# Patient Record
Sex: Male | Born: 1970
Health system: Southern US, Community
[De-identification: ages and names within clinical notes are randomized; demographics above are authoritative.]

## PROBLEM LIST (undated history)

## (undated) DIAGNOSIS — Z87442 Personal history of urinary calculi: Secondary | ICD-10-CM

## (undated) DIAGNOSIS — R7989 Other specified abnormal findings of blood chemistry: Secondary | ICD-10-CM

## (undated) DIAGNOSIS — E559 Vitamin D deficiency, unspecified: Secondary | ICD-10-CM

## (undated) DIAGNOSIS — E538 Deficiency of other specified B group vitamins: Secondary | ICD-10-CM

## (undated) DIAGNOSIS — K219 Gastro-esophageal reflux disease without esophagitis: Secondary | ICD-10-CM

## (undated) DIAGNOSIS — G4730 Sleep apnea, unspecified: Secondary | ICD-10-CM

## (undated) HISTORY — DX: Other specified abnormal findings of blood chemistry: R79.89

## (undated) HISTORY — DX: Vitamin D deficiency, unspecified: E55.9

## (undated) HISTORY — PX: HERNIA REPAIR: SHX51

## (undated) HISTORY — DX: Gastro-esophageal reflux disease without esophagitis: K21.9

## (undated) HISTORY — DX: Deficiency of other specified B group vitamins: E53.8

## (undated) HISTORY — DX: Sleep apnea, unspecified: G47.30

---

## 2009-02-07 HISTORY — PX: KNEE SURGERY: SHX244

## 2009-02-21 ENCOUNTER — Ambulatory Visit: Payer: Self-pay | Admitting: Specialist

## 2011-03-20 ENCOUNTER — Emergency Department: Payer: Self-pay | Admitting: Emergency Medicine

## 2011-03-20 LAB — URINALYSIS, COMPLETE
Bilirubin,UR: NEGATIVE
Glucose,UR: NEGATIVE mg/dL (ref 0–75)
Ketone: NEGATIVE
Leukocyte Esterase: NEGATIVE
Nitrite: NEGATIVE
Ph: 8 (ref 4.5–8.0)
Protein: NEGATIVE
RBC,UR: 686 /HPF (ref 0–5)
Specific Gravity: 1.018 (ref 1.003–1.030)
Squamous Epithelial: NONE SEEN
WBC UR: 1 /HPF (ref 0–5)

## 2011-03-20 LAB — BASIC METABOLIC PANEL
Calcium, Total: 8.8 mg/dL (ref 8.5–10.1)
Chloride: 104 mmol/L (ref 98–107)
Co2: 24 mmol/L (ref 21–32)
Osmolality: 283 (ref 275–301)
Potassium: 3.7 mmol/L (ref 3.5–5.1)

## 2011-03-20 LAB — CBC
HGB: 15.4 g/dL (ref 13.0–18.0)
Platelet: 182 10*3/uL (ref 150–440)
RBC: 4.87 10*6/uL (ref 4.40–5.90)
WBC: 10 10*3/uL (ref 3.8–10.6)

## 2013-05-26 ENCOUNTER — Ambulatory Visit: Payer: Self-pay | Admitting: Gastroenterology

## 2013-06-09 ENCOUNTER — Ambulatory Visit: Payer: Self-pay | Admitting: Urology

## 2014-09-01 ENCOUNTER — Ambulatory Visit: Payer: Self-pay | Admitting: Family Medicine

## 2014-09-01 DIAGNOSIS — G4730 Sleep apnea, unspecified: Secondary | ICD-10-CM | POA: Insufficient documentation

## 2014-09-01 DIAGNOSIS — N200 Calculus of kidney: Secondary | ICD-10-CM | POA: Insufficient documentation

## 2014-09-01 DIAGNOSIS — E559 Vitamin D deficiency, unspecified: Secondary | ICD-10-CM | POA: Insufficient documentation

## 2014-09-01 DIAGNOSIS — R7989 Other specified abnormal findings of blood chemistry: Secondary | ICD-10-CM | POA: Insufficient documentation

## 2014-09-01 DIAGNOSIS — K219 Gastro-esophageal reflux disease without esophagitis: Secondary | ICD-10-CM | POA: Insufficient documentation

## 2014-09-01 DIAGNOSIS — E538 Deficiency of other specified B group vitamins: Secondary | ICD-10-CM | POA: Insufficient documentation

## 2014-09-21 ENCOUNTER — Ambulatory Visit: Payer: Medicaid Other | Admitting: Family Medicine

## 2014-09-26 ENCOUNTER — Ambulatory Visit: Payer: Medicaid Other | Admitting: Family Medicine

## 2014-10-05 ENCOUNTER — Telehealth: Payer: Self-pay | Admitting: Family Medicine

## 2014-10-05 NOTE — Telephone Encounter (Signed)
That's fine to make the referral; document what you can (pain, swelling, injury, and which side); add to my referral to help Tiffany; thanks

## 2014-10-05 NOTE — Telephone Encounter (Signed)
So after researching this pt info. In order for him to receive a referral he has to be seen in the office because of his insurance medicaid requirement. Unless she has seen him for this issue already and documented this issue it will be fine to move forward. OR unless he no longer has Medicaid coverage. If not tell the wife he needs to be seen by his provider in order to get a referral.

## 2014-10-05 NOTE — Telephone Encounter (Signed)
Patient's wife states it is his right should. There was no injury, it just started hurting. Pain x 3 months, no swelling.

## 2014-10-05 NOTE — Telephone Encounter (Signed)
Routing to provider  

## 2014-10-05 NOTE — Telephone Encounter (Signed)
pts wife called and wanted referral to go see specialist about his shoulder. wife says she's left messages but i dont see any. She continued to talk over me and after i explained to her that i was only trying to help she calmed down a little bit but she was pretty upset and just basically wants a referral without an appt even after i explained that referrals are typically done through an appt.

## 2014-10-09 NOTE — Telephone Encounter (Signed)
I spoke with patients wife, she stated that she had already called [facility] and they had scheduled him an appointment for this Thursday. She was going to call them back and make sure they knew he had Medicaid.

## 2014-10-09 NOTE — Telephone Encounter (Signed)
Left message to call.

## 2015-04-26 ENCOUNTER — Ambulatory Visit (INDEPENDENT_AMBULATORY_CARE_PROVIDER_SITE_OTHER): Payer: Medicaid Other | Admitting: Family Medicine

## 2015-04-26 ENCOUNTER — Encounter: Payer: Self-pay | Admitting: Family Medicine

## 2015-04-26 VITALS — BP 116/72 | HR 54 | Temp 98.3°F | Ht 64.0 in | Wt 172.0 lb

## 2015-04-26 DIAGNOSIS — Z23 Encounter for immunization: Secondary | ICD-10-CM | POA: Diagnosis not present

## 2015-04-26 DIAGNOSIS — Z0000 Encounter for general adult medical examination without abnormal findings: Secondary | ICD-10-CM

## 2015-04-26 DIAGNOSIS — E291 Testicular hypofunction: Secondary | ICD-10-CM | POA: Diagnosis not present

## 2015-04-26 DIAGNOSIS — R7989 Other specified abnormal findings of blood chemistry: Secondary | ICD-10-CM

## 2015-04-26 DIAGNOSIS — E559 Vitamin D deficiency, unspecified: Secondary | ICD-10-CM

## 2015-04-26 DIAGNOSIS — L609 Nail disorder, unspecified: Secondary | ICD-10-CM | POA: Diagnosis not present

## 2015-04-26 DIAGNOSIS — K219 Gastro-esophageal reflux disease without esophagitis: Secondary | ICD-10-CM | POA: Diagnosis not present

## 2015-04-26 DIAGNOSIS — G4730 Sleep apnea, unspecified: Secondary | ICD-10-CM

## 2015-04-26 DIAGNOSIS — B353 Tinea pedis: Secondary | ICD-10-CM | POA: Diagnosis not present

## 2015-04-26 MED ORDER — ALUMINUM CHLORIDE 20 % EX SOLN: Freq: Every day | CUTANEOUS | Status: DC

## 2015-04-26 MED ORDER — ECONAZOLE NITRATE 1 % EX CREA: TOPICAL_CREAM | Freq: Every day | CUTANEOUS | Status: DC

## 2015-04-26 NOTE — Assessment & Plan Note (Addendum)
Will order referral to pulm for evaluation and treatment of possible OSA

## 2015-04-26 NOTE — Patient Instructions (Addendum)
We'll have you try the new medicine for your feet to see if it will help decrease sweating Use the new cream between your toes We'll have you see the dermatologist about your fingernail and feet Try to avoid foods and drinks that trigger reflux (tomatoes, garlic, onions, peppers, coffee, orange juice, etc.) We'll let you know about your lab results We'll refer you to someone to evaluate you for sleep apnea Do not drive if tired or you feel like you might fall asleep behind the wheel You received the flu shot today; it should protect you against the flu virus over the coming months; it will take about two weeks for antibodies to develop; do try to stay away from hospitals, nursing homes, and daycares during peak flu season; taking extra vitamin C daily during flu season may help you avoid getting sick   Health Maintenance, Male A healthy lifestyle and preventative care can promote health and wellness.  Maintain regular health, dental, and eye exams.  Eat a healthy diet. Foods like vegetables, fruits, whole grains, low-fat dairy products, and lean protein foods contain the nutrients you need and are low in calories. Decrease your intake of foods high in solid fats, added sugars, and salt. Get information about a proper diet from your health care provider, if necessary.  Regular physical exercise is one of the most important things you can do for your health. Most adults should get at least 150 minutes of moderate-intensity exercise (any activity that increases your heart rate and causes you to sweat) each week. In addition, most adults need muscle-strengthening exercises on 2 or more days a week.   Maintain a healthy weight. The body mass index (BMI) is a screening tool to identify possible weight problems. It provides an estimate of body fat based on height and weight. Your health care provider can find your BMI and can help you achieve or maintain a healthy weight. For males 20 years and  older:  A BMI below 18.5 is considered underweight.  A BMI of 18.5 to 24.9 is normal.  A BMI of 25 to 29.9 is considered overweight.  A BMI of 30 and above is considered obese.  Maintain normal blood lipids and cholesterol by exercising and minimizing your intake of saturated fat. Eat a balanced diet with plenty of fruits and vegetables. Blood tests for lipids and cholesterol should begin at age 21 and be repeated every 5 years. If your lipid or cholesterol levels are high, you are over age 64, or you are at high risk for heart disease, you may need your cholesterol levels checked more frequently.Ongoing high lipid and cholesterol levels should be treated with medicines if diet and exercise are not working.  If you smoke, find out from your health care provider how to quit. If you do not use tobacco, do not start.  Lung cancer screening is recommended for adults aged 55-80 years who are at high risk for developing lung cancer because of a history of smoking. A yearly low-dose CT scan of the lungs is recommended for people who have at least a 30-pack-year history of smoking and are current smokers or have quit within the past 15 years. A pack year of smoking is smoking an average of 1 pack of cigarettes a day for 1 year (for example, a 30-pack-year history of smoking could mean smoking 1 pack a day for 30 years or 2 packs a day for 15 years). Yearly screening should continue until the smoker has stopped smoking  for at least 15 years. Yearly screening should be stopped for people who develop a health problem that would prevent them from having lung cancer treatment.  If you choose to drink alcohol, do not have more than 2 drinks per day. One drink is considered to be 12 oz (360 mL) of beer, 5 oz (150 mL) of wine, or 1.5 oz (45 mL) of liquor.  Avoid the use of street drugs. Do not share needles with anyone. Ask for help if you need support or instructions about stopping the use of drugs.  High  blood pressure causes heart disease and increases the risk of stroke. High blood pressure is more likely to develop in:  People who have blood pressure in the end of the normal range (100-139/85-89 mm Hg).  People who are overweight or obese.  People who are African American.  If you are 65-41 years of age, have your blood pressure checked every 3-5 years. If you are 32 years of age or older, have your blood pressure checked every year. You should have your blood pressure measured twice--once when you are at a hospital or clinic, and once when you are not at a hospital or clinic. Record the average of the two measurements. To check your blood pressure when you are not at a hospital or clinic, you can use:  An automated blood pressure machine at a pharmacy.  A home blood pressure monitor.  If you are 32-15 years old, ask your health care provider if you should take aspirin to prevent heart disease.  Diabetes screening involves taking a blood sample to check your fasting blood sugar level. This should be done once every 3 years after age 58 if you are at a normal weight and without risk factors for diabetes. Testing should be considered at a younger age or be carried out more frequently if you are overweight and have at least 1 risk factor for diabetes.  Colorectal cancer can be detected and often prevented. Most routine colorectal cancer screening begins at the age of 22 and continues through age 74. However, your health care provider may recommend screening at an earlier age if you have risk factors for colon cancer. On a yearly basis, your health care provider may provide home test kits to check for hidden blood in the stool. A small camera at the end of a tube may be used to directly examine the colon (sigmoidoscopy or colonoscopy) to detect the earliest forms of colorectal cancer. Talk to your health care provider about this at age 68 when routine screening begins. A direct exam of the colon  should be repeated every 5-10 years through age 106, unless early forms of precancerous polyps or small growths are found.  People who are at an increased risk for hepatitis B should be screened for this virus. You are considered at high risk for hepatitis B if:  You were born in a country where hepatitis B occurs often. Talk with your health care provider about which countries are considered high risk.  Your parents were born in a high-risk country and you have not received a shot to protect against hepatitis B (hepatitis B vaccine).  You have HIV or AIDS.  You use needles to inject street drugs.  You live with, or have sex with, someone who has hepatitis B.  You are a man who has sex with other men (MSM).  You get hemodialysis treatment.  You take certain medicines for conditions like cancer, organ transplantation, and  autoimmune conditions.  Hepatitis C blood testing is recommended for all people born from [birth year] through 1966 and any individual with known risk factors for hepatitis C.  Healthy men should no longer receive prostate-specific antigen (PSA) blood tests as part of routine cancer screening. Talk to your health care provider about prostate cancer screening.  Testicular cancer screening is not recommended for adolescents or adult males who have no symptoms. Screening includes self-exam, a health care provider exam, and other screening tests. Consult with your health care provider about any symptoms you have or any concerns you have about testicular cancer.  Practice safe sex. Use condoms and avoid high-risk sexual practices to reduce the spread of sexually transmitted infections (STIs).  You should be screened for STIs, including gonorrhea and chlamydia if:  You are sexually active and are younger than 24 years.  You are older than 24 years, and your health care provider tells you that you are at risk for this type of infection.  Your sexual activity has changed since you  were last screened, and you are at an increased risk for chlamydia or gonorrhea. Ask your health care provider if you are at risk.  If you are at risk of being infected with HIV, it is recommended that you take a prescription medicine daily to prevent HIV infection. This is called pre-exposure prophylaxis (PrEP). You are considered at risk if:  You are a man who has sex with other men (MSM).  You are a heterosexual man who is sexually active with multiple partners.  You take drugs by injection.  You are sexually active with a partner who has HIV.  Talk with your health care provider about whether you are at high risk of being infected with HIV. If you choose to begin PrEP, you should first be tested for HIV. You should then be tested every 3 months for as long as you are taking PrEP.  Use sunscreen. Apply sunscreen liberally and repeatedly throughout the day. You should seek shade when your shadow is shorter than you. Protect yourself by wearing long sleeves, pants, a wide-brimmed hat, and sunglasses year round whenever you are outdoors.  Tell your health care provider of new moles or changes in moles, especially if there is a change in shape or color. Also, tell your health care provider if a mole is larger than the size of a pencil eraser.  A one-time screening for abdominal aortic aneurysm (AAA) and surgical repair of large AAAs by ultrasound is recommended for men aged 65-75 years who are current or former smokers.  Stay current with your vaccines (immunizations).   This information is not intended to replace advice given to you by your health care provider. Make sure you discuss any questions you have with your health care provider.   Document Released: 08/23/2007 Document Revised: 03/17/2014 Document Reviewed: 07/22/2010 Elsevier Interactive Patient Education [employer]

## 2015-04-26 NOTE — Assessment & Plan Note (Signed)
Check level today 

## 2015-04-26 NOTE — Assessment & Plan Note (Addendum)
Avoid eating late in the evening; avoid triggers; may try elevating HOB; no red flags

## 2015-04-26 NOTE — Progress Notes (Signed)
Patient ID: Carl Mcfarland, male   DOB: 1970-07-19, 45 y.o.   MRN: [id number]   Subjective:   Carl Mcfarland is a 45 y.o. male here for a complete physical exam  Interim issues since last visit: nothing  USPSTF grade A and B recommendations Alcohol: no Depression: Depression screen [facility] 2/9 04/26/2015  Decreased Interest 0  Down, Depressed, Hopeless 1  PHQ - 2 Score 1    Hypertension: well-controlled Obesity: he'll start to lose a little weight Tobacco use: nonsmoker HIV, hep B, hep C: politely declined STD testing and prevention (chl/gon/syphilis): asx Lipids: had a little bit in the morning Glucose: check today Colorectal cancer: no family hx, start at age 64 Aspirin: no heart attacks, no stroke; not needed Diet: good eater, at home, rarely fast food Exercise: exercise some Skin cancer: no worrisome moles  Past Medical History  Diagnosis Date  . Sleep apnea   . GERD (gastroesophageal reflux disease)   . Calculus of kidney   . Vitamin D deficiency disease   . Vitamin B12 deficiency   . Low testosterone    Past Surgical History  Procedure Laterality Date  . Hernia repair      umbilical  . Knee surgery Right Dec. 2010   Family History  Problem Relation Age of Onset  . Diabetes Mother   not overweight  Social History  Substance Use Topics  . Smoking status: Never Smoker   . Smokeless tobacco: Never Used  . Alcohol Use: Yes     Comment: socially   Review of Systems  Constitutional: Negative for unexpected weight change.  HENT: Negative for hearing loss.   Eyes: Positive for visual disturbance.  Cardiovascular: Negative for chest pain and leg swelling.  Endocrine: Negative for polydipsia.  Genitourinary: Negative for decreased urine volume and difficulty urinating.  Musculoskeletal: Negative for arthralgias.  Allergic/Immunologic: Negative for food allergies.  Hematological: Negative for adenopathy. Does not bruise/bleed easily.   Left ulnar 2nd finger growth  base of nail --> refer derm Fungus between the toes; athletes foot creams; they kept it at bay for a little bit; last year cleared up; tries to keep open  Objective:   Filed Vitals:   04/26/15 1507  BP: 116/72  Pulse: 54  Temp: 98.3 F (36.8 C)  Height:  (1.626 m)  Weight: 172 lb (78.019 kg)  SpO2: 98%   Body mass index is 29.51 kg/(m^2). Wt Readings from Last 3 Encounters:  04/26/15 172 lb (78.019 kg)  07/04/14 173 lb (78.472 kg)   Physical Exam  Constitutional: He appears well-developed and well-nourished. No distress.  HENT:  Head: Normocephalic and atraumatic.  Nose: Nose normal.  Mouth/Throat: Oropharynx is clear and moist.  Eyes: EOM are normal. No scleral icterus.  Neck: No JVD present. No thyromegaly present.  Cardiovascular: Normal rate, regular rhythm and normal heart sounds.   Pulmonary/Chest: Effort normal and breath sounds normal. No respiratory distress. He has no wheezes. He has no rales.  Abdominal: Soft. Bowel sounds are normal. He exhibits no distension. There is no tenderness. There is no guarding.  Musculoskeletal: Normal range of motion. He exhibits no edema.  Lymphadenopathy:    He has no cervical adenopathy.  Neurological: He is alert. He displays normal reflexes. He exhibits normal muscle tone. Coordination normal.  Skin: Skin is warm and dry. Rash (maceration between 4th and 5th toes) noted. He is not diaphoretic. No erythema. No pallor.  Abnormal growth along edge of fingernail/cuticle  Psychiatric: He has a normal mood  and affect. His behavior is normal. Judgment and thought content normal.    Assessment/Plan:   Problem List Items Addressed This Visit      Digestive   GERD (gastroesophageal reflux disease)    Avoid eating late in the evening; avoid triggers; may try elevating HOB; no red flags        Musculoskeletal and Integument   Nail abnormality    Refer to derm, may need excision      Relevant Orders   Ambulatory referral to  Dermatology   Tinea pedis    Start cream and talk to derm if not resolved by time of appt; avoid keeping feet closed up in sweaty socks/shoes all day; try drysol for excessive sweating      Relevant Medications   econazole nitrate 1 % cream   Other Relevant Orders   Ambulatory referral to Dermatology     Other   Sleep apnea    Will order referral to pulm for evaluation and treatment of possible OSA      Relevant Orders   Ambulatory referral to Pulmonology   Vitamin D deficiency disease    Recheck level today      Relevant Orders   VITAMIN D 25 Hydroxy (Vit-D Deficiency, Fractures) (Completed)   Low testosterone    Check level today      Relevant Orders   Testosterone, free, total (Completed)   Preventative health care - Primary    USPSTF grade A and B recommendations reviewed with patient; age-appropriate recommendations, preventive care, screening tests, etc discussed and encouraged; healthy living encouraged; see AVS for patient education given to patient      Relevant Orders   Lipid Panel w/o Chol/HDL Ratio (Completed)   CBC with Differential/Platelet (Completed)   Comprehensive metabolic panel (Completed)    Other Visit Diagnoses    Need for influenza vaccination        Relevant Orders    Flu Vaccine QUAD 36+ mos PF IM (Fluarix & Fluzone Quad PF)       Meds ordered this encounter  Medications  . aluminum chloride (DRYSOL) 20 % external solution    Sig: Apply topically at bedtime.    Dispense:  35 mL    Refill:  0  . econazole nitrate 1 % cream    Sig: Apply topically daily.    Dispense:  15 g    Refill:  0   Orders Placed This Encounter  Procedures  . Flu Vaccine QUAD 36+ mos PF IM (Fluarix & Fluzone Quad PF)  . Lipid Panel w/o Chol/HDL Ratio    Order Specific Question:  Has the patient fasted?    Answer:  Yes  . CBC with Differential/Platelet  . Comprehensive metabolic panel    Order Specific Question:  Has the patient fasted?    Answer:  Yes  .  VITAMIN D 25 Hydroxy (Vit-D Deficiency, Fractures)  . Testosterone, free, total  . Ambulatory referral to Dermatology    Referral Priority:  Routine    Referral Type:  Consultation    Referral Reason:  Specialty Services Required    Requested Specialty:  Dermatology    Number of Visits Requested:  1  . Ambulatory referral to Pulmonology    Referral Priority:  Routine    Referral Type:  Consultation    Referral Reason:  Specialty Services Required    Requested Specialty:  Pulmonary Disease    Number of Visits Requested:  1    Follow up plan: Return in about  1 year (around 04/25/2016) for complete physical.  An after-visit summary was printed and given to the patient at check-out.  Please see the patient instructions which may contain other information and recommendations beyond what is mentioned above in the assessment and plan.  Orders Placed This Encounter  Procedures  . Flu Vaccine QUAD 36+ mos PF IM (Fluarix & Fluzone Quad PF)  . Lipid Panel w/o Chol/HDL Ratio  . CBC with Differential/Platelet  . Comprehensive metabolic panel  . VITAMIN D 25 Hydroxy (Vit-D Deficiency, Fractures)  . Testosterone, free, total  . Ambulatory referral to Dermatology  . Ambulatory referral to Pulmonology   Flu shot was supposed to have been given today; I am trying to sign off of this note and immunization administration is "pending" by Caryn Bee; I clicked the button to "defer" and will send note to staff to complete documentation

## 2015-04-26 NOTE — Assessment & Plan Note (Signed)
Recheck level today 

## 2015-04-29 LAB — COMPREHENSIVE METABOLIC PANEL
ALBUMIN: 4.3 g/dL (ref 3.5–5.5)
ALT: 22 IU/L (ref 0–44)
AST: 19 IU/L (ref 0–40)
Albumin/Globulin Ratio: 1.6 (ref 1.1–2.5)
Alkaline Phosphatase: 93 IU/L (ref 39–117)
BUN / CREAT RATIO: 20 (ref 9–20)
BUN: 18 mg/dL (ref 6–24)
Bilirubin Total: 0.4 mg/dL (ref 0.0–1.2)
CALCIUM: 9.3 mg/dL (ref 8.7–10.2)
CO2: 22 mmol/L (ref 18–29)
CREATININE: 0.92 mg/dL (ref 0.76–1.27)
Chloride: 101 mmol/L (ref 96–106)
GFR calc Af Amer: 116 mL/min/{1.73_m2} (ref >59)
GFR, EST NON AFRICAN AMERICAN: 100 mL/min/{1.73_m2} (ref >59)
GLOBULIN, TOTAL: 2.7 g/dL (ref 1.5–4.5)
Glucose: 80 mg/dL (ref 65–99)
Potassium: 4.1 mmol/L (ref 3.5–5.2)
SODIUM: 140 mmol/L (ref 134–144)
Total Protein: 7 g/dL (ref 6.0–8.5)

## 2015-04-29 LAB — VITAMIN D 25 HYDROXY (VIT D DEFICIENCY, FRACTURES): Vit D, 25-Hydroxy: 23.9 ng/mL — ABNORMAL LOW (ref 30.0–100.0)

## 2015-04-29 LAB — CBC WITH DIFFERENTIAL/PLATELET
BASOS ABS: 0 10*3/uL (ref 0.0–0.2)
Basos: 0 %
EOS (ABSOLUTE): 0 10*3/uL (ref 0.0–0.4)
Eos: 0 %
Hematocrit: 45.1 % (ref 37.5–51.0)
Hemoglobin: 15.5 g/dL (ref 12.6–17.7)
Immature Grans (Abs): 0 10*3/uL (ref 0.0–0.1)
Immature Granulocytes: 0 %
LYMPHS ABS: 2.6 10*3/uL (ref 0.7–3.1)
Lymphs: 37 %
MCH: 30.6 pg (ref 26.6–33.0)
MCHC: 34.4 g/dL (ref 31.5–35.7)
MCV: 89 fL (ref 79–97)
Monocytes Absolute: 0.5 10*3/uL (ref 0.1–0.9)
Monocytes: 6 %
Neutrophils Absolute: 4 10*3/uL (ref 1.4–7.0)
Neutrophils: 57 %
PLATELETS: 211 10*3/uL (ref 150–379)
RBC: 5.06 x10E6/uL (ref 4.14–5.80)
RDW: 14 % (ref 12.3–15.4)
WBC: 7.1 10*3/uL (ref 3.4–10.8)

## 2015-04-29 LAB — TESTOSTERONE, FREE, TOTAL, SHBG
SEX HORMONE BINDING: 33.8 nmol/L (ref 16.5–55.9)
TESTOSTERONE: 373 ng/dL (ref 348–1197)
Testosterone, Free: 7.8 pg/mL (ref 6.8–21.5)

## 2015-04-29 LAB — LIPID PANEL W/O CHOL/HDL RATIO
Cholesterol, Total: 220 mg/dL — ABNORMAL HIGH (ref 100–199)
HDL: 49 mg/dL (ref >39)
LDL CALC: 152 mg/dL — AB (ref 0–99)
TRIGLYCERIDES: 95 mg/dL (ref 0–149)
VLDL Cholesterol Cal: 19 mg/dL (ref 5–40)

## 2015-05-03 ENCOUNTER — Telehealth: Payer: Self-pay | Admitting: Family Medicine

## 2015-05-03 NOTE — Telephone Encounter (Signed)
I called patient about labs LDL is elevated Voicemail not set up

## 2015-05-05 ENCOUNTER — Encounter: Payer: Self-pay | Admitting: Family Medicine

## 2015-05-05 DIAGNOSIS — Z0000 Encounter for general adult medical examination without abnormal findings: Secondary | ICD-10-CM | POA: Insufficient documentation

## 2015-05-05 NOTE — Assessment & Plan Note (Signed)
Refer to derm, may need excision

## 2015-05-05 NOTE — Assessment & Plan Note (Signed)
USPSTF grade A and B recommendations reviewed with patient; age-appropriate recommendations, preventive care, screening tests, etc discussed and encouraged; healthy living encouraged; see AVS for patient education given to patient  

## 2015-05-05 NOTE — Assessment & Plan Note (Signed)
Start cream and talk to derm if not resolved by time of appt; avoid keeping feet closed up in sweaty socks/shoes all day; try drysol for excessive sweating

## 2015-05-05 NOTE — Telephone Encounter (Signed)
I have not been able to reach patient; I'll send a letter

## 2015-05-09 NOTE — Addendum Note (Signed)
Addended by: Marshall Cork on: 05/09/2015 01:33 PM   Modules accepted: Kipp Brood

## 2015-06-07 ENCOUNTER — Encounter: Payer: Self-pay | Admitting: Internal Medicine

## 2015-06-07 ENCOUNTER — Encounter (INDEPENDENT_AMBULATORY_CARE_PROVIDER_SITE_OTHER): Payer: Self-pay

## 2015-06-07 ENCOUNTER — Ambulatory Visit (INDEPENDENT_AMBULATORY_CARE_PROVIDER_SITE_OTHER): Payer: Medicaid Other | Admitting: Internal Medicine

## 2015-06-07 VITALS — BP 124/68 | HR 57 | Ht 65.0 in | Wt 170.4 lb

## 2015-06-07 DIAGNOSIS — G4719 Other hypersomnia: Secondary | ICD-10-CM

## 2015-06-07 NOTE — Progress Notes (Signed)
[facility]  Pulmonary Medicine Consultation      Assessment and Plan:  Excessive daytime sleepiness. -Symptoms and signs suggestive of sleep apnea. -We will send for sleep study.  GERD -Sleep apnea can contribute to GERD, we'll continue to monitor. -Some of his nocturnal episodes of awakening, may have some relationship with GERD. -If continues to have symptoms after started on CPAP. We'll consider empiric treatment.  Snoring, witnessed apneas. -suspect obstructive sleep apnea, we'll send for sleep study.  Date: 06/07/2015  MRN# [id number] Carl Mcfarland [birth year]  Referring Physician: Dr. Sherie DonLada.   Carl Mcfarland is a 45 y.o. old male seen in consultation for chief complaint of:    Chief Complaint  Patient presents with  . sleep consult    pt ref by dr. lada. pt c/o gasping for air while sleep & loud snoring. no prior sleep study EPWORTH:18    HPI:   The patient is a 45 year old male referred for gasping for air. Loud snoring. He typically goes to bed around 10 PM, usually takes him about 10-15 minutes to fall asleep. Typically gets up from bed around 6 AM. His Epworth score today is 18.  His wife notes that this has been going on for several years. He has noted that he is waking himself up choking more and more often and these are becoming distressful.  He notes that he is sleepy during the day. This appears to be his main problem, more so than excessive daytime sleepiness, though he does complain of some fatigue during the day, he occasionally has taken a nap during the day.   PMHX:   Past Medical History  Diagnosis Date  . Sleep apnea   . GERD (gastroesophageal reflux disease)   . Calculus of kidney   . Vitamin D deficiency disease   . Vitamin B12 deficiency   . Low testosterone    Surgical Hx:  Past Surgical History  Procedure Laterality Date  . Hernia repair      umbilical  . Knee surgery Right Dec. 2010   Family Hx:  Family History  Problem Relation Age of  Onset  . Diabetes Mother    Social Hx:   Social History  Substance Use Topics  . Smoking status: Never Smoker   . Smokeless tobacco: Never Used  . Alcohol Use: Yes     Comment: socially   Medication:   Current Outpatient Rx  Name  Route  Sig  Dispense  Refill  . aluminum chloride (DRYSOL) 20 % external solution   Topical   Apply topically at bedtime.   35 mL   0   . econazole nitrate 1 % cream   Topical   Apply topically daily.   15 g   0       Allergies:  Review of patient's allergies indicates no known allergies.  Review of Systems: Gen:  Denies  fever, sweats, chills HEENT: Denies blurred vision, double vision. bleeds, sore throat Cvc:  No dizziness, chest pain. Resp:   Denies cough or sputum production, shortness of breath Gi: Denies swallowing difficulty, stomach pain. Gu:  Denies bladder incontinence, burning urine Ext:   No Joint pain, stiffness. Skin: No skin rash,  hives  Endoc:  No polyuria, polydipsia. Psych: No depression, insomnia. Other:  All other systems were reviewed with the patient and were negative other that what is mentioned in the HPI.   Physical Examination:   VS: BP 124/68 mmHg  Pulse 57  Ht 5\' 5"  (1.651 m)  Wt  170 lb 6.4 oz (77.293 kg)  BMI 28.36 kg/m2  SpO2 98%  General Appearance: No distress  Neuro:without focal findings,  speech normal,  HEENT: PERRLA, EOM intact.   Pulmonary: normal breath sounds, No wheezing.  CardiovascularNormal S1,S2.  No m/r/g.   Abdomen: Benign, Soft, non-tender. Renal:  No costovertebral tenderness  GU:  No performed at this time. Endoc: No evident thyromegaly, no signs of acromegaly. Skin:   warm, no rashes, no ecchymosis  Extremities: normal, no cyanosis, clubbing.  Other findings:    LABORATORY PANEL:   CBC No results for input(s): WBC, HGB, HCT, PLT in the last 168  hours. ------------------------------------------------------------------------------------------------------------------  Chemistries  No results for input(s): NA, K, CL, CO2, GLUCOSE, BUN, CREATININE, CALCIUM, MG, AST, ALT, ALKPHOS, BILITOT in the last 168 hours.  Invalid input(s): GFRCGP ------------------------------------------------------------------------------------------------------------------  Cardiac Enzymes No results for input(s): TROPONINI in the last 168 hours. ------------------------------------------------------------  RADIOLOGY:  No results found.     Thank  you for the consultation and for allowing [facility] to assist in the care of your patient. Our recommendations are noted above.  Please contact us if we can be of further service.   Wells Guiles, MD.  Board Certified in Internal Medicine, Pulmonary Medicine, Critical Care Medicine, and Sleep Medicine.  [facility] Office Number: [phone]  Santiago Glad, M.D.  Stephanie Acre, M.D.  Billy Fischer, M.D  06/07/2015

## 2015-06-07 NOTE — Patient Instructions (Signed)
    Sleep Apnea Sleep apnea is disorder that affects a person's sleep. A person with sleep apnea has abnormal pauses in their breathing when they sleep. It is hard for them to get a good sleep. This makes a person tired during the day. It also can lead to other physical problems. There are three types of sleep apnea. One type is when breathing stops for a short time because your airway is blocked (obstructive sleep apnea). Another type is when the brain sometimes fails to give the normal signal to breathe to the muscles that control your breathing (central sleep apnea). The third type is a combination of the other two types. HOME CARE   Take all medicine as told by your doctor.  Avoid alcohol, calming medicines (sedatives), and depressant drugs.  Try to lose weight if you are overweight. Talk to your doctor about a healthy weight goal.  Your doctor may have you use a device that helps to open your airway. It can help you get the air that you need. It is called a positive airway pressure (PAP) device.   MAKE SURE YOU:   Understand these instructions.  Will watch your condition.  Will get help right away if you are not doing well or get worse.  It may take approximately 1 month for you to get used to wearing her CPAP every night.  

## 2015-06-12 ENCOUNTER — Ambulatory Visit: Payer: Medicaid Other | Admitting: Family Medicine

## 2015-07-13 ENCOUNTER — Ambulatory Visit: Payer: Medicaid Other | Attending: Pulmonary Disease

## 2015-07-13 DIAGNOSIS — R0683 Snoring: Secondary | ICD-10-CM | POA: Diagnosis not present

## 2015-07-13 DIAGNOSIS — G4719 Other hypersomnia: Secondary | ICD-10-CM | POA: Insufficient documentation

## 2015-07-18 DIAGNOSIS — R0683 Snoring: Secondary | ICD-10-CM | POA: Diagnosis not present

## 2015-07-19 ENCOUNTER — Telehealth: Payer: Self-pay | Admitting: *Deleted

## 2015-07-19 NOTE — Telephone Encounter (Signed)
Pt informed sleep study was negative for sleep apnea. Nothing further needed.

## 2015-11-14 ENCOUNTER — Ambulatory Visit: Payer: Medicaid Other | Admitting: Family Medicine

## 2016-03-11 ENCOUNTER — Inpatient Hospital Stay
Admission: RE | Admit: 2016-03-11 | Discharge: 2016-03-11 | Disposition: A | Discharge status: Home / Self Care | Payer: Medicaid Other | Source: Ambulatory Visit

## 2016-03-11 HISTORY — DX: Personal history of urinary calculi: Z87.442

## 2016-03-12 ENCOUNTER — Encounter
Admission: RE | Admit: 2016-03-12 | Discharge: 2016-03-12 | Disposition: A | Discharge status: Home / Self Care | Payer: Medicaid Other | Source: Ambulatory Visit | Attending: Orthopedic Surgery | Admitting: Orthopedic Surgery

## 2016-03-12 NOTE — Patient Instructions (Signed)
  Your procedure is scheduled on: 03-20-16 Report to Same Day Surgery 2nd floor medical mall [facility](Medical Mall Entrance-take elevator on left to 2nd floor.  Check in with surgery information desk.) To find out your arrival time please call [phone] between 1PM - 3PM on 03-19-16  Remember: Instructions that are not followed completely may result in serious medical risk, up to and including death, or upon the discretion of your surgeon and anesthesiologist your surgery may need to be rescheduled.    _x___ 1. Do not eat food or drink liquids after midnight. No gum chewing or hard candies.     __x__ 2. No Alcohol for 24 hours before or after surgery.   __x__3. No Smoking for 24 prior to surgery.   ____  4. Bring all medications with you on the day of surgery if instructed.    __x__ 5. Notify your doctor if there is any change in your medical condition     (cold, fever, infections).     Do not wear jewelry, make-up, hairpins, clips or nail polish.  Do not wear lotions, powders, or perfumes. You may wear deodorant.  Do not shave 48 hours prior to surgery. Men may shave face and neck.  Do not bring valuables to the hospital.    [facility] is not responsible for any belongings or valuables.               Contacts, dentures or bridgework may not be worn into surgery.  Leave your suitcase in the car. After surgery it may be brought to your room.  For patients admitted to the hospital, discharge time is determined by your treatment team.   Patients discharged the day of surgery will not be allowed to drive home.  You will need someone to drive you home and stay with you the night of your procedure.    Please read over the following fact sheets that you were given:   [facility] Preparing for Surgery and or MRSA Information   _x___ Take these medicines the morning of surgery with A SIP OF WATER:    1. PRILOSEC  2. TAKE A PRILOSEC ON Wednesday NIGHT BEFORE BED  3.  4.  5.  6.  ____Fleets  enema or Magnesium Citrate as directed.   ____ Use CHG Soap or sage wipes as directed on instruction sheet   ____ Use inhalers on the day of surgery and bring to hospital day of surgery  ____ Stop metformin 2 days prior to surgery    ____ Take 1/2 of usual insulin dose the night before surgery and none on the morning of  surgery.   ____ Stop Aspirin, Coumadin, Pllavix ,Eliquis, Effient, or Pradaxa  x__ Stop Anti-inflammatories such as Advil, Aleve, Ibuprofen, Motrin, Naproxen,          Naprosyn, Goodies powders or aspirin products NOW-Ok to take Tylenol.   ____ Stop supplements until after surgery.    ____ Bring C-Pap to the hospital.

## 2016-03-19 MED ORDER — CEFAZOLIN SODIUM-DEXTROSE 2-4 GM/100ML-% IV SOLN
2.0000 g | Freq: Once | INTRAVENOUS | Status: AC
  Administered 2016-03-20: 2 g via INTRAVENOUS

## 2016-03-20 ENCOUNTER — Ambulatory Visit: Payer: Medicaid Other | Admitting: Anesthesiology

## 2016-03-20 ENCOUNTER — Ambulatory Visit
Admission: RE | Admit: 2016-03-20 | Discharge: 2016-03-20 | Disposition: A | Discharge status: Home / Self Care | Payer: Medicaid Other | Source: Ambulatory Visit | Attending: Orthopedic Surgery | Admitting: Orthopedic Surgery

## 2016-03-20 ENCOUNTER — Encounter
Admission: RE | Disposition: A | Discharge status: Home / Self Care | Payer: Self-pay | Source: Ambulatory Visit | Attending: Orthopedic Surgery

## 2016-03-20 DIAGNOSIS — K219 Gastro-esophageal reflux disease without esophagitis: Secondary | ICD-10-CM | POA: Insufficient documentation

## 2016-03-20 DIAGNOSIS — M67431 Ganglion, right wrist: Secondary | ICD-10-CM | POA: Diagnosis not present

## 2016-03-20 HISTORY — PX: GANGLION CYST EXCISION: SHX1691

## 2016-03-20 SURGERY — EXCISION, GANGLION CYST, WRIST
Anesthesia: General | Laterality: Right | Wound class: Clean Contaminated

## 2016-03-20 MED ORDER — BUPIVACAINE HCL (PF) 0.5 % IJ SOLN
INTRAMUSCULAR | Status: AC
  Filled 2016-03-20: qty 30

## 2016-03-20 MED ORDER — ONDANSETRON HCL 4 MG PO TABS: 4.0000 mg | ORAL_TABLET | Freq: Four times a day (QID) | ORAL | Status: DC | PRN

## 2016-03-20 MED ORDER — MIDAZOLAM HCL 2 MG/2ML IJ SOLN
INTRAMUSCULAR | Status: DC | PRN
  Administered 2016-03-20: 2 mg via INTRAVENOUS

## 2016-03-20 MED ORDER — PROPOFOL 10 MG/ML IV BOLUS
INTRAVENOUS | Status: DC | PRN
  Administered 2016-03-20: 130 mg via INTRAVENOUS

## 2016-03-20 MED ORDER — PROPOFOL 10 MG/ML IV BOLUS
INTRAVENOUS | Status: AC
  Filled 2016-03-20: qty 20

## 2016-03-20 MED ORDER — ONDANSETRON HCL 4 MG/2ML IJ SOLN
INTRAMUSCULAR | Status: DC | PRN
  Administered 2016-03-20: 4 mg via INTRAVENOUS

## 2016-03-20 MED ORDER — MEPERIDINE HCL 25 MG/ML IJ SOLN: 6.2500 mg | INTRAMUSCULAR | Status: DC | PRN

## 2016-03-20 MED ORDER — KETOROLAC TROMETHAMINE 30 MG/ML IJ SOLN
INTRAMUSCULAR | Status: AC
  Filled 2016-03-20: qty 1

## 2016-03-20 MED ORDER — KETOROLAC TROMETHAMINE 30 MG/ML IJ SOLN
INTRAMUSCULAR | Status: DC | PRN
  Administered 2016-03-20: 30 mg via INTRAVENOUS

## 2016-03-20 MED ORDER — HYDROCODONE-ACETAMINOPHEN 5-325 MG PO TABS: 1.0000 | ORAL_TABLET | Freq: Four times a day (QID) | ORAL | 0 refills | Status: DC | PRN

## 2016-03-20 MED ORDER — ONDANSETRON HCL 4 MG/2ML IJ SOLN: 4.0000 mg | Freq: Four times a day (QID) | INTRAMUSCULAR | Status: DC | PRN

## 2016-03-20 MED ORDER — METOCLOPRAMIDE HCL 5 MG/ML IJ SOLN: 5.0000 mg | Freq: Three times a day (TID) | INTRAMUSCULAR | Status: DC | PRN

## 2016-03-20 MED ORDER — MIDAZOLAM HCL 2 MG/2ML IJ SOLN
INTRAMUSCULAR | Status: AC
  Filled 2016-03-20: qty 2

## 2016-03-20 MED ORDER — SODIUM CHLORIDE 0.9 % IV SOLN: INTRAVENOUS | Status: DC

## 2016-03-20 MED ORDER — HYDROCODONE-ACETAMINOPHEN 5-325 MG PO TABS: 1.0000 | ORAL_TABLET | ORAL | Status: DC | PRN

## 2016-03-20 MED ORDER — FENTANYL CITRATE (PF) 100 MCG/2ML IJ SOLN
INTRAMUSCULAR | Status: AC
  Filled 2016-03-20: qty 2

## 2016-03-20 MED ORDER — CEFAZOLIN SODIUM-DEXTROSE 2-4 GM/100ML-% IV SOLN
INTRAVENOUS | Status: AC
  Filled 2016-03-20: qty 100

## 2016-03-20 MED ORDER — OXYCODONE HCL 5 MG/5ML PO SOLN: 5.0000 mg | Freq: Once | ORAL | Status: DC | PRN

## 2016-03-20 MED ORDER — BUPIVACAINE HCL 0.5 % IJ SOLN
INTRAMUSCULAR | Status: DC | PRN
  Administered 2016-03-20: 5 mL

## 2016-03-20 MED ORDER — GLYCOPYRROLATE 0.2 MG/ML IJ SOLN
INTRAMUSCULAR | Status: DC | PRN
  Administered 2016-03-20: 0.2 mg via INTRAVENOUS

## 2016-03-20 MED ORDER — LACTATED RINGERS IV SOLN
INTRAVENOUS | Status: DC
Start: 2016-03-20 — End: 2016-03-20
  Administered 2016-03-20: 08:00:00 via INTRAVENOUS

## 2016-03-20 MED ORDER — FENTANYL CITRATE (PF) 100 MCG/2ML IJ SOLN
INTRAMUSCULAR | Status: DC | PRN
  Administered 2016-03-20 (×4): 25 ug via INTRAVENOUS

## 2016-03-20 MED ORDER — GLYCOPYRROLATE 0.2 MG/ML IJ SOLN
INTRAMUSCULAR | Status: AC
  Filled 2016-03-20: qty 1

## 2016-03-20 MED ORDER — METOCLOPRAMIDE HCL 10 MG PO TABS
5.0000 mg | ORAL_TABLET | Freq: Three times a day (TID) | ORAL | Status: DC | PRN
Start: 2016-03-20 — End: 2016-03-20

## 2016-03-20 MED ORDER — LIDOCAINE 2% (20 MG/ML) 5 ML SYRINGE
INTRAMUSCULAR | Status: AC
  Filled 2016-03-20: qty 5

## 2016-03-20 MED ORDER — DEXAMETHASONE SODIUM PHOSPHATE 4 MG/ML IJ SOLN
INTRAMUSCULAR | Status: DC | PRN
  Administered 2016-03-20: 5 mg via INTRAVENOUS

## 2016-03-20 MED ORDER — ONDANSETRON HCL 4 MG/2ML IJ SOLN
INTRAMUSCULAR | Status: AC
  Filled 2016-03-20: qty 2

## 2016-03-20 MED ORDER — LIDOCAINE HCL (CARDIAC) 20 MG/ML IV SOLN
INTRAVENOUS | Status: DC | PRN
  Administered 2016-03-20: 100 mg via INTRAVENOUS

## 2016-03-20 MED ORDER — FENTANYL CITRATE (PF) 100 MCG/2ML IJ SOLN: 25.0000 ug | INTRAMUSCULAR | Status: DC | PRN

## 2016-03-20 MED ORDER — DEXAMETHASONE SODIUM PHOSPHATE 10 MG/ML IJ SOLN
INTRAMUSCULAR | Status: AC
  Filled 2016-03-20: qty 1

## 2016-03-20 MED ORDER — OXYCODONE HCL 5 MG PO TABS: 5.0000 mg | ORAL_TABLET | Freq: Once | ORAL | Status: DC | PRN

## 2016-03-20 MED ORDER — PROMETHAZINE HCL 25 MG/ML IJ SOLN: 6.2500 mg | INTRAMUSCULAR | Status: DC | PRN

## 2016-03-20 SURGICAL SUPPLY — 32 items
BANDAGE ELASTIC 3 LF NS (GAUZE/BANDAGES/DRESSINGS) ×2 IMPLANT
BNDG ESMARK 4X12 TAN STRL LF (GAUZE/BANDAGES/DRESSINGS) ×2 IMPLANT
CANISTER SUCT 1200ML W/VALVE (MISCELLANEOUS) ×2 IMPLANT
CAST PADDING 3X4FT ST 30246 (SOFTGOODS) ×1
CHLORAPREP W/TINT 26ML (MISCELLANEOUS) ×2 IMPLANT
CUFF TOURN 18 STER (MISCELLANEOUS) IMPLANT
ELECT CAUTERY NEEDLE 2.0 MIC (NEEDLE) ×2 IMPLANT
GAUZE PETRO XEROFOAM 1X8 (MISCELLANEOUS) ×2 IMPLANT
GAUZE SPONGE 4X4 12PLY STRL (GAUZE/BANDAGES/DRESSINGS) ×2 IMPLANT
GAUZE XEROFORM 4X4 STRL (GAUZE/BANDAGES/DRESSINGS) ×2 IMPLANT
GLOVE BIOGEL PI IND STRL 9 (GLOVE) ×1 IMPLANT
GLOVE BIOGEL PI INDICATOR 9 (GLOVE) ×1
GLOVE SURG SYN 9.0  PF PI (GLOVE) ×1
GLOVE SURG SYN 9.0 PF PI (GLOVE) ×1 IMPLANT
GOWN SRG 2XL LVL 4 RGLN SLV (GOWNS) ×1 IMPLANT
GOWN STRL NON-REIN 2XL LVL4 (GOWNS) ×1
GOWN STRL REUS W/ TWL LRG LVL3 (GOWN DISPOSABLE) ×1 IMPLANT
GOWN STRL REUS W/TWL LRG LVL3 (GOWN DISPOSABLE) ×1
KIT RM TURNOVER STRD PROC AR (KITS) ×2 IMPLANT
NEEDLE HYPO 25X1 1.5 SAFETY (NEEDLE) ×2 IMPLANT
NS IRRIG 500ML POUR BTL (IV SOLUTION) ×2 IMPLANT
PACK EXTREMITY ARMC (MISCELLANEOUS) ×2 IMPLANT
PAD CAST CTTN 3X4 STRL (SOFTGOODS) ×1 IMPLANT
SPLINT CAST 1 STEP 3X12 (MISCELLANEOUS) ×2 IMPLANT
STOCKINETTE STRL 4IN 9604848 (GAUZE/BANDAGES/DRESSINGS) ×2 IMPLANT
SUT ETHILON 4-0 (SUTURE) ×1
SUT ETHILON 4-0 FS2 18XMFL BLK (SUTURE) ×1
SUT ETHILON 5-0 FS-2 18 BLK (SUTURE) ×2 IMPLANT
SUT MNCRL 4-0 (SUTURE) ×1
SUT MNCRL 4-018XMFL (SUTURE) ×1
SUTURE ETHLN 4-0 FS2 18XMF BLK (SUTURE) ×1 IMPLANT
SUTURE MNCRL 4-018XMF (SUTURE) ×1 IMPLANT

## 2016-03-20 NOTE — Anesthesia Preprocedure Evaluation (Signed)
Anesthesia Evaluation  Patient identified by MRN, date of birth, ID band Patient awake    Reviewed: Allergy & Precautions, NPO status , Patient's Chart, lab work & pertinent test results  History of Anesthesia Complications Negative for: history of anesthetic complications  Airway Mallampati: II  TM Distance: >3 FB Neck ROM: Full    Dental no notable dental hx.    Pulmonary neg pulmonary ROS, neg sleep apnea, neg COPD,    breath sounds clear to auscultation- rhonchi (-) wheezing      Cardiovascular Exercise Tolerance: Good (-) hypertension(-) CAD and (-) Past MI  Rhythm:Regular Rate:Normal - Systolic murmurs and - Diastolic murmurs    Neuro/Psych negative neurological ROS  negative psych ROS   GI/Hepatic Neg liver ROS, GERD  ,  Endo/Other  negative endocrine ROSneg diabetes  Renal/GU Renal disease: hx of nephrolithiasis.     Musculoskeletal negative musculoskeletal ROS (+)   Abdominal (+) - obese,   Peds  Hematology negative hematology ROS (+)   Anesthesia Other Findings Past Medical History: No date: GERD (gastroesophageal reflux disease) No date: History of kidney stones No date: Low testosterone No date: Vitamin B12 deficiency No date: Vitamin D deficiency disease   Reproductive/Obstetrics                             Anesthesia Physical Anesthesia Plan  ASA: II  Anesthesia Plan: General   Post-op Pain Management:    Induction: Intravenous  Airway Management Planned: LMA  Additional Equipment:   Intra-op Plan:   Post-operative Plan:   Informed Consent: I have reviewed the patients History and Physical, chart, labs and discussed the procedure including the risks, benefits and alternatives for the proposed anesthesia with the patient or authorized representative who has indicated his/her understanding and acceptance.   Dental advisory given  Plan Discussed with: CRNA  and Anesthesiologist  Anesthesia Plan Comments:         Anesthesia Quick Evaluation

## 2016-03-20 NOTE — H&P (Signed)
Patient presents with  . Hand Pain  lump on right hand   Carl Mcfarland is a 46 y.o. male who presents  With gangion, dorsum of right wrist.. Patient's had 9 months of right hand pain. He points to the dorsal aspect of the right wrist and carpal region. Patient has 7 out of 10 pain. Pain and swelling has progressed. Patient paints for living, has increased pain with activity. He has tried bracing with no improvement. Pain interferes with his ability to work activities of daily living. He denies any trauma or injury. His x-rays show no evidence of acute bony normality.  Past Medical History: History reviewed. No pertinent past medical history.  Past Surgical History: Past Surgical History:  Procedure Laterality Date  . HERNIA REPAIR  . KNEE SURGERY 2010   Past Family History: Family History  Problem Relation Age of Onset  . No Known Problems Mother   Medications: No current Epic-ordered outpatient prescriptions on file.   No current Epic-ordered facility-administered medications on file.   Allergies: No Known Allergies   Review of Systems:  A comprehensive 14 point ROS was performed, reviewed by me today, and the pertinent orthopaedic findings are documented in the HPI.  Exam: BP 134/82 (BP Location: Left upper arm, Patient Position: Sitting, BP Cuff Size: Adult)  Ht 157.5 cm (5\' 2" )  Wt 76.7 kg (169 lb 3.2 oz)  BMI 30.95 kg/m2  General:  Well developed, well nourished, no apparent distress, normal affect, normal gait with no antalgic component.   HEENT: Head normocephalic, atraumatic, PERRL.   Abdomen: Soft, non tender, non distended, Bowel sounds present.  Heart: Examination of the heart reveals regular, rate, and rhythm. There is no murmur noted on ascultation. There is a normal apical pulse.  Lungs: Lungs are clear to auscultation. There is no wheeze, rhonchi, or crackles. There is normal expansion of bilateral chest walls.   Right hand: Patient has small area  of soft tissue swelling along the dorsal aspect of the right hand just distal to the radiocarpal joint. Area of swelling is tender, slightly fluctuant with no warmth or redness. She has no tendon deficits noted.  Imaging: X-rays of the right hand [facility]20/2015 show no evidence of acute bony abnormality. Mild dorsal spurring. No soft tissue swelling.  Impression: Ganglion cyst of dorsum of right wrist [M67.431] Ganglion cyst of dorsum of right wrist (primary encounter diagnosis)  Plan:  1. Risks, benefits, complications of a right dorsal wrist ganglion cyst excision were discussed with the patient. Patient has agreed and consented to procedure with Dr. Rosita KeaMenz. Patient will need Velcro wrist brace for 4 weeks. He will follow-up 3 days after surgery, 10-14 days after surgery and 4 weeks after surgery.

## 2016-03-20 NOTE — Discharge Instructions (Signed)
Loosen Ace wrap if fingers swell. Work on finger motion. Back of hand may be numb from local anesthetic today. Leave cotton-roll on the underneath until return visit

## 2016-03-20 NOTE — Anesthesia Procedure Notes (Signed)
Procedure Name: LMA Insertion Date/Time: 03/20/2016 8:44 AM Performed by: Shirlee LimerickMARION, Siarah Deleo Pre-anesthesia Checklist: Patient identified, Emergency Drugs available, Suction available and Patient being monitored Patient Re-evaluated:Patient Re-evaluated prior to inductionOxygen Delivery Method: Circle system utilized Preoxygenation: Pre-oxygenation with 100% oxygen Intubation Type: IV induction LMA: LMA inserted Number of attempts: 1 Placement Confirmation: breath sounds checked- equal and bilateral Tube secured with: Tape

## 2016-03-20 NOTE — Transfer of Care (Signed)
Immediate Anesthesia Transfer of Care Note  Patient: Carl Mcfarland  Procedure(s) Performed: Procedure(s): REMOVAL GANGLION OF WRIST (Right)  Patient Location: PACU  Anesthesia Type:General  Level of Consciousness: awake, oriented and patient cooperative  Airway & Oxygen Therapy: Patient Spontanous Breathing and Patient connected to face mask oxygen  Post-op Assessment: Report given to RN, Post -op Vital signs reviewed and stable and Patient moving all extremities X 4  Post vital signs: Reviewed and stable  Last Vitals:  Vitals:   03/20/16 0727 03/20/16 0921  BP: 104/63 102/67  Pulse: (!) 55 (!) 49  Resp: 16 (!) 9  Temp: 36.5 C 36.6 C    Last Pain:  Vitals:   03/20/16 0921  TempSrc:   PainSc: Asleep         Complications: No apparent anesthesia complications

## 2016-03-20 NOTE — Op Note (Signed)
03/20/2016  9:20 AM  PATIENT:  Carl Mcfarland  46 y.o. male  PRE-OPERATIVE DIAGNOSIS:  GANGLION CYST OF DORSUM OF RIGHT WRIST  POST-OPERATIVE DIAGNOSIS:  GANGLION CYST OF DORSUM OF RIGHT WRIST  PROCEDURE:  Procedure(s): REMOVAL GANGLION OF WRIST (Right)  SURGEON: Leitha SchullerMichael J Guadalupe Nickless, MD  ASSISTANTS: None  ANESTHESIA:   general  EBL:  Total I/O In: 350 [I.V.:350] Out: 1 [Blood:1]  BLOOD ADMINISTERED:none  DRAINS: none   LOCAL MEDICATIONS USED:  MARCAINE     SPECIMEN:  No Specimen  DISPOSITION OF SPECIMEN:  N/A  COUNTS:  YES  TOURNIQUET:  * No tourniquets in log *  IMPLANTS: None  DICTATION: .Dragon Dictation patient was brought the operating room and after adequate general anesthesia was obtained, the right arm was prepped and draped in usual sterile fashion with an Esmarch tourniquet utilized during the procedure. After prepping draping the sterile manner appropriate patient identification and timeout procedures were completed. Esmarch was used to exsanguinate the hand and was used as a tourniquet above the wrist. On the dorsum of the hand there is a ganglion cyst proximally a centimeter in diameter at the base of the third [facility] joint a transverse incision was made with preservation of dorsal veins the extensor tendons were retracted laterally and medially with exposure of the cyst a rongeur was then used to debride the cyst and then additional use a rongeur to get rid of bony spurs on either side of the cyst. After the bony surfaces were smoothed unassisted been entirely excised the wound was irrigated and then closed with simple 4-0 nylon skin suture with 5 cc of half percent Sensorcaine infiltrated around the incision for postop analgesia. The Esmarch was removed and there is minimal bleeding the wound was dressed with Xeroform 4 x 4 web roll and Ace wrap  PLAN OF CARE: Discharge to home after PACU  PATIENT DISPOSITION:  PACU - hemodynamically stable.

## 2016-03-20 NOTE — Anesthesia Postprocedure Evaluation (Signed)
Anesthesia Post Note  Patient: Carl Mcfarland  Procedure(s) Performed: Procedure(s) (LRB): REMOVAL GANGLION OF WRIST (Right)  Patient location during evaluation: PACU Anesthesia Type: General Level of consciousness: awake and alert and oriented Pain management: pain level controlled Vital Signs Assessment: post-procedure vital signs reviewed and stable Respiratory status: spontaneous breathing, nonlabored ventilation and respiratory function stable Cardiovascular status: blood pressure returned to baseline and stable Postop Assessment: no signs of nausea or vomiting Anesthetic complications: no     Last Vitals:  Vitals:   03/20/16 0952 03/20/16 0959  BP: 96/70   Pulse: (!) 49 (!) 49  Resp: 18 16  Temp:  36.8 C    Last Pain:  Vitals:   03/20/16 0921  TempSrc:   PainSc: Asleep                 Jaynia Fendley

## 2016-06-05 ENCOUNTER — Encounter: Payer: Medicaid Other | Admitting: Family Medicine

## 2016-06-16 ENCOUNTER — Encounter: Payer: Medicaid Other | Admitting: Family Medicine

## 2016-08-07 ENCOUNTER — Ambulatory Visit: Payer: Medicaid Other | Admitting: Family Medicine

## 2016-08-20 ENCOUNTER — Ambulatory Visit (INDEPENDENT_AMBULATORY_CARE_PROVIDER_SITE_OTHER): Payer: BLUE CROSS/BLUE SHIELD | Admitting: Family Medicine

## 2016-08-20 ENCOUNTER — Encounter: Payer: Self-pay | Admitting: Family Medicine

## 2016-08-20 VITALS — BP 94/62 | HR 54 | Temp 98.1°F | Resp 14 | Ht 63.7 in | Wt 176.0 lb

## 2016-08-20 DIAGNOSIS — Z0000 Encounter for general adult medical examination without abnormal findings: Secondary | ICD-10-CM

## 2016-08-20 DIAGNOSIS — Z1211 Encounter for screening for malignant neoplasm of colon: Secondary | ICD-10-CM | POA: Diagnosis not present

## 2016-08-20 DIAGNOSIS — E538 Deficiency of other specified B group vitamins: Secondary | ICD-10-CM | POA: Diagnosis not present

## 2016-08-20 DIAGNOSIS — M26622 Arthralgia of left temporomandibular joint: Secondary | ICD-10-CM

## 2016-08-20 DIAGNOSIS — E669 Obesity, unspecified: Secondary | ICD-10-CM | POA: Insufficient documentation

## 2016-08-20 DIAGNOSIS — E559 Vitamin D deficiency, unspecified: Secondary | ICD-10-CM

## 2016-08-20 LAB — COMPLETE METABOLIC PANEL WITH GFR
ALBUMIN: 4.1 g/dL (ref 3.6–5.1)
ALK PHOS: 67 U/L (ref 40–115)
ALT: 24 U/L (ref 9–46)
AST: 21 U/L (ref 10–40)
BILIRUBIN TOTAL: 0.3 mg/dL (ref 0.2–1.2)
BUN: 20 mg/dL (ref 7–25)
CO2: 24 mmol/L (ref 20–31)
CREATININE: 0.9 mg/dL (ref 0.60–1.35)
Calcium: 8.8 mg/dL (ref 8.6–10.3)
Chloride: 106 mmol/L (ref 98–110)
GFR, Est African American: >89 mL/min (ref >=60)
GLUCOSE: 78 mg/dL (ref 65–99)
Potassium: 4.3 mmol/L (ref 3.5–5.3)
Sodium: 139 mmol/L (ref 135–146)
TOTAL PROTEIN: 6.8 g/dL (ref 6.1–8.1)

## 2016-08-20 LAB — CBC WITH DIFFERENTIAL/PLATELET
BASOS ABS: 58 {cells}/uL (ref 0–200)
Basophils Relative: 1 %
EOS ABS: 116 {cells}/uL (ref 15–500)
EOS PCT: 2 %
HCT: 45.5 % (ref 38.5–50.0)
Hemoglobin: 15.4 g/dL (ref 13.2–17.1)
LYMPHS PCT: 38 %
Lymphs Abs: 2204 cells/uL (ref 850–3900)
MCH: 30.6 pg (ref 27.0–33.0)
MCHC: 33.8 g/dL (ref 32.0–36.0)
MCV: 90.5 fL (ref 80.0–100.0)
MONOS PCT: 9 %
MPV: 9.1 fL (ref 7.5–12.5)
Monocytes Absolute: 522 cells/uL (ref 200–950)
NEUTROS ABS: 2900 {cells}/uL (ref 1500–7800)
NEUTROS PCT: 50 %
PLATELETS: 201 10*3/uL (ref 140–400)
RBC: 5.03 MIL/uL (ref 4.20–5.80)
RDW: 13.9 % (ref 11.0–15.0)
WBC: 5.8 10*3/uL (ref 3.8–10.8)

## 2016-08-20 LAB — LIPID PANEL
CHOLESTEROL: 189 mg/dL (ref <200)
HDL: 42 mg/dL (ref >40)
LDL Cholesterol: 124 mg/dL — ABNORMAL HIGH (ref <100)
Total CHOL/HDL Ratio: 4.5 Ratio (ref <5.0)
Triglycerides: 114 mg/dL (ref <150)
VLDL: 23 mg/dL (ref <30)

## 2016-08-20 NOTE — Assessment & Plan Note (Signed)
USPSTF grade A and B recommendations reviewed with patient; age-appropriate recommendations, preventive care, screening tests, etc discussed and encouraged; healthy living encouraged; see AVS for patient education given to patient  

## 2016-08-20 NOTE — Assessment & Plan Note (Signed)
Encouraged patient to watch his weight; he'll become more active and expects to lose over the summer

## 2016-08-20 NOTE — Assessment & Plan Note (Signed)
Check labs 

## 2016-08-20 NOTE — Assessment & Plan Note (Signed)
Discussed ACS guidelines just updated colon cancer screening starting at age 47; other groups say start at age 47; he agrees with starting now

## 2016-08-20 NOTE — Assessment & Plan Note (Signed)
Check lab

## 2016-08-20 NOTE — Progress Notes (Signed)
BP 94/62   Pulse (!) 54   Temp 98.1 F (36.7 C) (Oral)   Resp 14   Ht 5' 3.7" (1.618 m)   Wt 176 lb (79.8 kg)   SpO2 97%   BMI 30.50 kg/m    Subjective:    Patient ID: Carl Mcfarland, male    DOB: 1970/03/31, 46 y.o.   MRN: [id number]  HPI: Carl Mcfarland is a 46 y.o. male  Chief Complaint  Patient presents with  . Annual Exam   No interim excitement; got flu shot last year Hit his chin a few weeks ago; now has some discomfort along left TMJ  HPI Patient is here for a complete physical USPSTF grade A and B recommendations Depression:  Depression screen 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 Wt Readings from Last 3 Encounters:  08/20/16 176 lb (79.8 kg)  03/20/16 170 lb (77.1 kg)  03/11/16 169 lb (76.7 kg)   BMI Readings from Last 3 Encounters:  08/20/16 30.50 kg/m  03/20/16 28.29 kg/m  03/11/16 28.12 kg/m    Alcohol: no Tobacco use: no HIV, hep B, hep C: declined Married STD testing and prevention(chl/gon/syphilis): declined Lipids: check fasting Lab Results  Component Value Date   CHOL 220 (H) 04/26/2015   Lab Results  Component Value Date   HDL 49 04/26/2015   Lab Results  Component Value Date   LDLCALC 152 (H) 04/26/2015   Lab Results  Component Value Date   TRIG 95 04/26/2015   No results found for: CHOLHDL No results found for: LDLDIRECT Glucose:  Glucose  Date Value Ref Range Status  04/26/2015 80 65 - 99 mg/dL Final  40/98/1191 478 (H) 65 - 99 mg/dL Final   Colorectal cancer: no fam hx; agrees to ACS screening at age 4; agrees to get colonoscopy Prostate cancer: n/a No results found for: PSA Lung cancer:  n/a AAA: n/a Aspirin: n/a Diet: tries to avoid fatty foods; not  much fiber Exercise: more active at work this time of year Skin cancer: nothing worrisome  Depression screen [facility] 2/9 08/20/2016 04/26/2015  Decreased Interest 0 0  Down, Depressed, Hopeless 0 1  PHQ - 2 Score 0 1    Relevant past medical, surgical, family and social history reviewed Past Medical History:  Diagnosis Date  . GERD (gastroesophageal reflux disease)   . History of kidney stones   . Low testosterone   . Vitamin B12 deficiency   . Vitamin D deficiency disease    Past Surgical History:  Procedure Laterality Date  . GANGLION CYST EXCISION Right 03/20/2016   Procedure: REMOVAL GANGLION OF WRIST;  Surgeon: Kennedy Bucker, MD;  Location: [facility] ORS;  Service: Orthopedics;  Laterality: Right;  . HERNIA REPAIR     umbilical  . KNEE SURGERY Right Dec. 2010   Family History  Problem Relation Age of Onset  . Diabetes Mother   . Heart attack Maternal Grandfather   . Dementia Paternal Grandfather    Social History   Social History  . Marital status: Married    Spouse name: N/A  . Number of children: N/A  . Years of education: N/A   Occupational History  . Not on file.  Social History Main Topics  . Smoking status: Never Smoker  . Smokeless tobacco: Never Used  . Alcohol use No  . Drug use: No     Comment: past use  . Sexual activity: Not on file   Other Topics Concern  . Not on file   Social History Narrative  . No narrative on file    Interim medical history since last visit reviewed. Allergies and medications reviewed  Review of Systems Per HPI unless specifically indicated above     Objective:    BP 94/62   Pulse (!) 54   Temp 98.1 F (36.7 C) (Oral)   Resp 14   Ht 5' 3.7" (1.618 m)   Wt 176 lb (79.8 kg)   SpO2 97%   BMI 30.50 kg/m   Wt Readings from Last 3 Encounters:  08/20/16 176 lb (79.8 kg)  03/20/16 170 lb (77.1 kg)  03/11/16 169 lb (76.7 kg)    Physical Exam  Constitutional: He appears well-developed and well-nourished. No  distress.  HENT:  Head: Normocephalic and atraumatic.  Right Ear: Hearing, tympanic membrane, external ear and ear canal normal. No mastoid tenderness.  Left Ear: Hearing, tympanic membrane, external ear and ear canal normal. No mastoid tenderness.  Nose: Nose normal. No rhinorrhea.  Mouth/Throat: Oropharynx is clear and moist and mucous membranes are normal.  Mild tenderness along LEFT TMJ; no subluxation  Eyes: EOM are normal. No scleral icterus.  Neck: No JVD present. No thyromegaly present.  Cardiovascular: Regular rhythm and normal heart sounds.  Bradycardia present.   Pulmonary/Chest: Effort normal and breath sounds normal. No respiratory distress. He has no wheezes. He has no rales.  Abdominal: Soft. Bowel sounds are normal. He exhibits no distension. There is no tenderness. There is no guarding.  Musculoskeletal: Normal range of motion. He exhibits no edema.  Lymphadenopathy:    He has no cervical adenopathy.  Neurological: He is alert. He displays normal reflexes. He exhibits normal muscle tone. Coordination normal.  Skin: Skin is warm and dry. No rash noted. He is not diaphoretic. No erythema. No pallor.  Psychiatric: He has a normal mood and affect. His behavior is normal. Judgment and thought content normal.    Results for orders placed or performed in visit on 04/26/15  Lipid Panel w/o Chol/HDL Ratio  Result Value Ref Range   Cholesterol, Total 220 (H) 100 - 199 mg/dL   Triglycerides 95 0 - 149 mg/dL   HDL 49 >16 mg/dL   VLDL Cholesterol Cal 19 5 - 40 mg/dL   LDL Calculated 109 (H) 0 - 99 mg/dL  CBC with Differential/Platelet  Result Value Ref Range   WBC 7.1 3.4 - 10.8 x10E3/uL   RBC 5.06 4.14 - 5.80 x10E6/uL   Hemoglobin 15.5 12.6 - 17.7 g/dL   Hematocrit 60.4 54.0 - 51.0 %   MCV 89 79 - 97 fL   MCH 30.6 26.6 - 33.0 pg   MCHC 34.4 31.5 - 35.7 g/dL   RDW 98.1 19.1 - 47.8 %   Platelets 211 150 - 379 x10E3/uL   Neutrophils 57 %   Lymphs 37 %   Monocytes 6 %    Eos 0 %   Basos 0 %   Neutrophils Absolute 4.0 1.4 - 7.0 x10E3/uL   Lymphocytes Absolute 2.6 0.7 - 3.1 x10E3/uL   Monocytes Absolute 0.5 0.1 - 0.9 x10E3/uL   EOS (ABSOLUTE) 0.0 0.0 - 0.4 x10E3/uL   Basophils Absolute 0.0 0.0 - 0.2 x10E3/uL   Immature  Granulocytes 0 %   Immature Grans (Abs) 0.0 0.0 - 0.1 x10E3/uL  Comprehensive metabolic panel  Result Value Ref Range   Glucose 80 65 - 99 mg/dL   BUN 18 6 - 24 mg/dL   Creatinine, Ser 1.61 0.76 - 1.27 mg/dL   GFR calc non Af Amer 100 >59 mL/min/1.73   GFR calc Af Amer 116 >59 mL/min/1.73   BUN/Creatinine Ratio 20 9 - 20   Sodium 140 134 - 144 mmol/L   Potassium 4.1 3.5 - 5.2 mmol/L   Chloride 101 96 - 106 mmol/L   CO2 22 18 - 29 mmol/L   Calcium 9.3 8.7 - 10.2 mg/dL   Total Protein 7.0 6.0 - 8.5 g/dL   Albumin 4.3 3.5 - 5.5 g/dL   Globulin, Total 2.7 1.5 - 4.5 g/dL   Albumin/Globulin Ratio 1.6 1.1 - 2.5   Bilirubin Total 0.4 0.0 - 1.2 mg/dL   Alkaline Phosphatase 93 39 - 117 IU/L   AST 19 0 - 40 IU/L   ALT 22 0 - 44 IU/L  VITAMIN D 25 Hydroxy (Vit-D Deficiency, Fractures)  Result Value Ref Range   Vit D, 25-Hydroxy 23.9 (L) 30.0 - 100.0 ng/mL  Testosterone, free, total  Result Value Ref Range   Testosterone 373 348 - 1,197 ng/dL   Comment, Testosterone Comment    Testosterone, Free 7.8 6.8 - 21.5 pg/mL   Sex Hormone Binding 33.8 16.5 - 55.9 nmol/L      Assessment & Plan:   Problem List Items Addressed This Visit      Other   Vitamin D deficiency disease    Check lab      Relevant Orders   VITAMIN D 25 Hydroxy (Vit-D Deficiency, Fractures)   Vitamin B12 deficiency    Check labs      Relevant Orders   B12   Preventative health care - Primary    USPSTF grade A and B recommendations reviewed with patient; age-appropriate recommendations, preventive care, screening tests, etc discussed and encouraged; healthy living encouraged; see AVS for patient education given to patient       Relevant Orders   COMPLETE  METABOLIC PANEL WITH GFR   CBC with Differential/Platelet   Lipid panel   Obesity (BMI 30.0-34.9)    Encouraged patient to watch his weight; he'll become more active and expects to lose over the summer      Colon cancer screening    Discussed ACS guidelines just updated colon cancer screening starting at age 72; other groups say start at age 67; he agrees with starting now      Relevant Orders   Ambulatory referral to Gastroenterology    Other Visit Diagnoses    TMJ tenderness, left       after hitting mandible; likely mild strain of left TMJ; do not suspect dislocation or ligamentous injury; try heat, NSAID; call if not improving       Follow up plan: Return in about 1 year (around 08/20/2017) for complete physical.  An after-visit summary was printed and given to the patient at check-out.  Please see the patient instructions which may contain other information and recommendations beyond what is mentioned above in the assessment and plan.  No orders of the defined types were placed in this encounter.   Orders Placed This Encounter  Procedures  . COMPLETE METABOLIC PANEL WITH GFR  . CBC with Differential/Platelet  . Lipid panel  . VITAMIN D 25 Hydroxy (Vit-D Deficiency, Fractures)  . B12  .  Ambulatory referral to Gastroenterology

## 2016-08-20 NOTE — Patient Instructions (Addendum)
We'll get labs today We'll have you see the doctor for a screening colonoscopy If you have not heard anything from my staff in a week about any orders/referrals/studies from today, please contact us here to follow-up [phone]   Health Maintenance, Male A healthy lifestyle and preventive care is important for your health and wellness. Ask your health care provider about what schedule of regular examinations is right for you. What should I know about weight and diet? Eat a Healthy Diet  Eat plenty of vegetables, fruits, whole grains, low-fat dairy products, and lean protein.  Do not eat a lot of foods high in solid fats, added sugars, or salt.  Maintain a Healthy Weight Regular exercise can help you achieve or maintain a healthy weight. You should:  Do at least 150 minutes of exercise each week. The exercise should increase your heart rate and make you sweat (moderate-intensity exercise).  Do strength-training exercises at least twice a week.  Watch Your Levels of Cholesterol and Blood Lipids  Have your blood tested for lipids and cholesterol every 5 years starting at 47 years of age. If you are at high risk for heart disease, you should start having your blood tested when you are 47 years old. You may need to have your cholesterol levels checked more often if: ? Your lipid or cholesterol levels are high. ? You are older than 47 years of age. ? You are at high risk for heart disease.  What should I know about cancer screening? Many types of cancers can be detected early and may often be prevented. Lung Cancer  You should be screened every year for lung cancer if: ? You are a current smoker who has smoked for at least 30 years. ? You are a former smoker who has quit within the past 15 years.  Talk to your health care provider about your screening options, when you should start screening, and how often you should be screened.  Colorectal Cancer  Routine colorectal cancer  screening usually begins at 47 years of age and should be repeated every 5-10 years until you are 47 years old. You may need to be screened more often if early forms of precancerous polyps or small growths are found. Your health care provider may recommend screening at an earlier age if you have risk factors for colon cancer.  Your health care provider may recommend using home test kits to check for hidden blood in the stool.  A small camera at the end of a tube can be used to examine your colon (sigmoidoscopy or colonoscopy). This checks for the earliest forms of colorectal cancer.  Prostate and Testicular Cancer  Depending on your age and overall health, your health care provider may do certain tests to screen for prostate and testicular cancer.  Talk to your health care provider about any symptoms or concerns you have about testicular or prostate cancer.  Skin Cancer  Check your skin from head to toe regularly.  Tell your health care provider about any new moles or changes in moles, especially if: ? There is a change in a mole's size, shape, or color. ? You have a mole that is larger than a pencil eraser.  Always use sunscreen. Apply sunscreen liberally and repeat throughout the day.  Protect yourself by wearing long sleeves, pants, a wide-brimmed hat, and sunglasses when outside.  What should I know about heart disease, diabetes, and high blood pressure?  If you are 7818-47 years of age, have your  blood pressure checked every 3-5 years. If you are 44 years of age or older, have your blood pressure checked every year. You should have your blood pressure measured twice-once when you are at a hospital or clinic, and once when you are not at a hospital or clinic. Record the average of the two measurements. To check your blood pressure when you are not at a hospital or clinic, you can use: ? An automated blood pressure machine at a pharmacy. ? A home blood pressure monitor.  Talk to your  health care provider about your target blood pressure.  If you are between 44-50 years old, ask your health care provider if you should take aspirin to prevent heart disease.  Have regular diabetes screenings by checking your fasting blood sugar level. ? If you are at a normal weight and have a low risk for diabetes, have this test once every three years after the age of 53. ? If you are overweight and have a high risk for diabetes, consider being tested at a younger age or more often.  A one-time screening for abdominal aortic aneurysm (AAA) by ultrasound is recommended for men aged 69-75 years who are current or former smokers. What should I know about preventing infection? Hepatitis B If you have a higher risk for hepatitis B, you should be screened for this virus. Talk with your health care provider to find out if you are at risk for hepatitis B infection. Hepatitis C Blood testing is recommended for:  Everyone born from [birth year] through 1966.  Anyone with known risk factors for hepatitis C.  Sexually Transmitted Diseases (STDs)  You should be screened each year for STDs including gonorrhea and chlamydia if: ? You are sexually active and are younger than 47 years of age. ? You are older than 47 years of age and your health care provider tells you that you are at risk for this type of infection. ? Your sexual activity has changed since you were last screened and you are at an increased risk for chlamydia or gonorrhea. Ask your health care provider if you are at risk.  Talk with your health care provider about whether you are at high risk of being infected with HIV. Your health care provider may recommend a prescription medicine to help prevent HIV infection.  What else can I do?  Schedule regular health, dental, and eye exams.  Stay current with your vaccines (immunizations).  Do not use any tobacco products, such as cigarettes, chewing tobacco, and e-cigarettes. If you need help  quitting, ask your health care provider.  Limit alcohol intake to no more than 2 drinks per day. One drink equals 12 ounces of beer, 5 ounces of wine, or 1 ounces of hard liquor.  Do not use street drugs.  Do not share needles.  Ask your health care provider for help if you need support or information about quitting drugs.  Tell your health care provider if you often feel depressed.  Tell your health care provider if you have ever been abused or do not feel safe at home. This information is not intended to replace advice given to you by your health care provider. Make sure you discuss any questions you have with your health care provider. Document Released: 08/23/2007 Document Revised: 10/24/2015 Document Reviewed: 11/28/2014 Elsevier Interactive Patient Education  [employer].

## 2016-08-21 LAB — VITAMIN D 25 HYDROXY (VIT D DEFICIENCY, FRACTURES): VIT D 25 HYDROXY: 25 ng/mL — AB (ref 30–100)

## 2016-08-21 LAB — VITAMIN B12: VITAMIN B 12: 346 pg/mL (ref 200–1100)

## 2016-09-04 ENCOUNTER — Telehealth: Payer: Self-pay

## 2016-09-04 ENCOUNTER — Other Ambulatory Visit: Payer: Self-pay

## 2016-09-04 DIAGNOSIS — Z1211 Encounter for screening for malignant neoplasm of colon: Secondary | ICD-10-CM

## 2016-09-04 NOTE — Telephone Encounter (Signed)
Gastroenterology Pre-Procedure Review  Request Date: 09/22/16 Requesting Physician: Dr. Servando SnareWohl  PATIENT REVIEW QUESTIONS: The patient responded to the following health history questions as indicated:    1. Are you having any GI issues? no 2. Do you have a personal history of Polyps? no 3. Do you have a family history of Colon Cancer or Polyps? no 4. Diabetes Mellitus? no 5. Joint replacements in the past 12 months?no 6. Major health problems in the past 3 months?no 7. Any artificial heart valves, MVP, or defibrillator?no    MEDICATIONS & ALLERGIES:    Patient reports the following regarding taking any anticoagulation/antiplatelet therapy:   Plavix, Coumadin, Eliquis, Xarelto, Lovenox, Pradaxa, Brilinta, or Effient? no Aspirin? no  Patient confirms/reports the following medications:  Current Outpatient Prescriptions  Medication Sig Dispense Refill  . omeprazole (PRILOSEC OTC) 20 MG tablet Take 20 mg by mouth daily as needed (indigestion).     No current facility-administered medications for this visit.     Patient confirms/reports the following allergies:  No Known Allergies  No orders of the defined types were placed in this encounter.   AUTHORIZATION INFORMATION Primary Insurance: 1D#: Group #:  Secondary Insurance: 1D#: Group #:  SCHEDULE INFORMATION: Date: 09/22/16 Time: Location:[facility]

## 2016-09-17 ENCOUNTER — Encounter: Payer: Self-pay | Admitting: Anesthesiology

## 2016-09-17 ENCOUNTER — Encounter: Payer: Self-pay | Admitting: *Deleted

## 2016-09-24 ENCOUNTER — Ambulatory Visit
Admission: RE | Admit: 2016-09-24 | Payer: BLUE CROSS/BLUE SHIELD | Source: Ambulatory Visit | Admitting: Gastroenterology

## 2016-09-24 SURGERY — COLONOSCOPY WITH PROPOFOL
Anesthesia: Choice

## 2017-03-23 ENCOUNTER — Encounter: Payer: Self-pay | Admitting: Family Medicine

## 2017-03-23 ENCOUNTER — Ambulatory Visit: Payer: BLUE CROSS/BLUE SHIELD | Admitting: Family Medicine

## 2017-03-23 VITALS — BP 112/72 | HR 65 | Temp 98.3°F | Resp 14 | Wt 178.1 lb

## 2017-03-23 DIAGNOSIS — E559 Vitamin D deficiency, unspecified: Secondary | ICD-10-CM

## 2017-03-23 DIAGNOSIS — E538 Deficiency of other specified B group vitamins: Secondary | ICD-10-CM

## 2017-03-23 DIAGNOSIS — R5383 Other fatigue: Secondary | ICD-10-CM | POA: Diagnosis not present

## 2017-03-23 DIAGNOSIS — M25511 Pain in right shoulder: Secondary | ICD-10-CM

## 2017-03-23 DIAGNOSIS — R6882 Decreased libido: Secondary | ICD-10-CM

## 2017-03-23 MED ORDER — IBUPROFEN 600 MG PO TABS: 600.0000 mg | ORAL_TABLET | Freq: Three times a day (TID) | ORAL | 1 refills | Status: DC | PRN

## 2017-03-23 NOTE — Progress Notes (Signed)
BP 112/72   Pulse 65   Temp 98.3 F (36.8 C) (Oral)   Resp 14   Wt 178 lb 1.6 oz (80.8 kg)   SpO2 96%   BMI 30.86 kg/m    Subjective:    Patient ID: Carl Mcfarland, male    DOB: 1970-06-11, 47 y.o.   MRN: [id number]  HPI: Carl BarreCesar Donathan is a 47 y.o. male  Chief Complaint  Patient presents with  . Shoulder Pain    bilateral onset 2 weeks    HPI Patient is having pain in the right shoulder for 2 weeks Left started a few days ago Reaching up hurts No pain with reaching behind back No weakness No numbness or tingling He tried advil or tylenol or ibuprofen; ibuprofen helps the most No neck pain  Mother has arthritis He is right-handed He remembers something similar a long while ago; they gave him a shot in the shoulder  He quit taking vitamin B12 and vitamin D He feels tired in the morning; sleeps 8 hours a day; he used to get by on much less sleep; thinks he is just getting older Reviewed the sleep study from 2017 He does snore, wife tries to get him to move; he already had the sleep study; seen by Dr. Nicholos Johnsamachandran (pulmonologist, sleep specialist); he used to have choking episodes; went to test here at the hospital Would like testosterone checked Sex drive is low; no loss of hair, no testicular atrophy Mother has diabetes  [facility]2017 08/20/2016 04/26/2015  Decreased Interest 0 0 0  Down, Depressed, Hopeless 0 0 1  PHQ - 2 Score 0 0 1    Relevant past medical, surgical, family and social history reviewed Past Medical History:  Diagnosis Date  . GERD (gastroesophageal reflux disease)   . History of kidney stones   . Low testosterone   . Vitamin B12 deficiency   . Vitamin D deficiency disease    Past Surgical History:  Procedure Laterality Date  . GANGLION CYST EXCISION Right 03/20/2016   Procedure: REMOVAL GANGLION OF WRIST;  Surgeon: Kennedy BuckerMichael Menz, MD;  Location: [facility] ORS;  Service: Orthopedics;  Laterality: Right;  . HERNIA REPAIR     umbilical  . KNEE SURGERY Right Dec. 2010   Family History  Problem Relation Age of Onset  . Diabetes Mother   . Heart attack Maternal Grandfather   . Dementia Paternal Grandfather    Social History   Tobacco Use  . Smoking status: Never Smoker  . Smokeless tobacco: Never Used  Substance Use Topics  . Alcohol use: No  . Drug use: No    Comment: past use    Interim medical history since last visit reviewed. Allergies and medications reviewed  Review of Systems Per HPI unless specifically indicated above     Objective:    BP 112/72   Pulse 65   Temp 98.3 F (36.8 C) (Oral)   Resp 14   Wt 178 lb 1.6 oz (80.8 kg)   SpO2 96%   BMI 30.86 kg/m   Wt Readings from Last 3 Encounters:  03/23/17 178 lb 1.6 oz (80.8 kg)  09/17/16 176 lb (79.8 kg)  08/20/16 176 lb (79.8 kg)    Physical Exam  Constitutional: He appears well-developed and well-nourished. No distress.  Eyes: No scleral icterus.  Cardiovascular: Normal rate and regular rhythm.  Pulmonary/Chest: Effort normal and breath sounds normal.  Neurological: He is alert.  Skin: He is not diaphoretic. No pallor.  Psychiatric: He has a normal mood and affect. His mood appears not anxious. He does not exhibit a depressed mood.    Results for orders placed or performed in visit on 08/20/16  COMPLETE METABOLIC PANEL WITH GFR  Result Value Ref Range   Sodium 139 135 - 146 mmol/L   Potassium 4.3 3.5 - 5.3 mmol/L   Chloride 106 98 - 110 mmol/L   CO2 24 20 - 31 mmol/L   Glucose, Bld 78 65 - 99 mg/dL   BUN 20 7 - 25 mg/dL   Creat 1.61 0.96 - 0.45 mg/dL   Total Bilirubin 0.3 0.2 - 1.2 mg/dL   Alkaline Phosphatase 67 40 - 115 U/L   AST 21 10 - 40 U/L   ALT 24 9 - 46 U/L   Total Protein 6.8 6.1 - 8.1 g/dL   Albumin 4.1 3.6 - 5.1 g/dL   Calcium 8.8 8.6 - 40.9 mg/dL   GFR, Est African American >89 >=60 mL/min   GFR, Est Non African American >89 >=60 mL/min  CBC with Differential/Platelet  Result Value Ref Range   WBC  5.8 3.8 - 10.8 K/uL   RBC 5.03 4.20 - 5.80 MIL/uL   Hemoglobin 15.4 13.2 - 17.1 g/dL   HCT 81.1 91.4 - 78.2 %   MCV 90.5 80.0 - 100.0 fL   MCH 30.6 27.0 - 33.0 pg   MCHC 33.8 32.0 - 36.0 g/dL   RDW 95.6 21.3 - 08.6 %   Platelets 201 140 - 400 K/uL   MPV 9.1 7.5 - 12.5 fL   Neutro Abs 2,900 1,500 - 7,800 cells/uL   Lymphs Abs 2,204 850 - 3,900 cells/uL   Monocytes Absolute 522 200 - 950 cells/uL   Eosinophils Absolute 116 15 - 500 cells/uL   Basophils Absolute 58 0 - 200 cells/uL   Neutrophils Relative % 50 %   Lymphocytes Relative 38 %   Monocytes Relative 9 %   Eosinophils Relative 2 %   Basophils Relative 1 %   Smear Review SEE NOTE   Lipid panel  Result Value Ref Range   Cholesterol 189 <200 mg/dL   Triglycerides 578 <469 mg/dL   HDL 42 >62 mg/dL   Total CHOL/HDL Ratio 4.5 <5.0 Ratio   VLDL 23 <30 mg/dL   LDL Cholesterol 952 (H) <100 mg/dL  VITAMIN D 25 Hydroxy (Vit-D Deficiency, Fractures)  Result Value Ref Range   Vit D, 25-Hydroxy 25 (L) 30 - 100 ng/mL  B12  Result Value Ref Range   Vitamin B-12 346 200 - 1,100 pg/mL      Assessment & Plan:   Problem List Items Addressed This Visit      Other   Vitamin D deficiency disease    Check level and supplement if needed, may contribute to fatigue      Relevant Orders   VITAMIN D 25 Hydroxy (Vit-D Deficiency, Fractures)   Vitamin B12 deficiency    Check level and supplement if needed, may contribute to fatigue      Relevant Orders   B12    Other Visit Diagnoses    Acute pain of right shoulder    -  Primary   suspect impingement syndrome or tendonitis; refer to PT; may refer to ortho if not improving   Relevant Orders   Ambulatory referral to Physical Therapy   Other fatigue       patient wishes to check testosteron level; best if drawn between 8-10 in the morning, earlier better; he'll  return another day for labs   Relevant Orders   Basic metabolic panel   CBC with Differential/Platelet   Decreased libido        Relevant Orders   Testosterone       Follow up plan: No Follow-up on file.  An after-visit summary was printed and given to the patient at check-out.  Please see the patient instructions which may contain other information and recommendations beyond what is mentioned above in the assessment and plan.  Meds ordered this encounter  Medications  . ibuprofen (ADVIL,MOTRIN) 600 MG tablet    Sig: Take 1 tablet (600 mg total) by mouth every 8 (eight) hours as needed. Take with food    Dispense:  30 tablet    Refill:  1    Orders Placed This Encounter  Procedures  . B12  . VITAMIN D 25 Hydroxy (Vit-D Deficiency, Fractures)  . Testosterone  . Basic metabolic panel  . CBC with Differential/Platelet  . Ambulatory referral to Physical Therapy     Orders printed by Tiffany, to be placed up front

## 2017-03-23 NOTE — Patient Instructions (Addendum)
Use the ibuprofen We'll have you see the physical therapist I'll suggest a multiple vitamin in the morning Return for labs tomorrow morning (you can drink black coffee and water, but no calories or food before you come after midnight)  Shoulder Pain Many things can cause shoulder pain, including:  An injury.  Moving the arm in the same way again and again (overuse).  Joint pain (arthritis).  Follow these instructions at home: Take these actions to help with your pain:  Squeeze a soft ball or a foam pad as much as you can. This helps to prevent swelling. It also makes the arm stronger.  Take over-the-counter and prescription medicines only as told by your doctor.  If told, put ice on the area: ? Put ice in a plastic bag. ? Place a towel between your skin and the bag. ? Leave the ice on for 20 minutes, 2-3 times per day. Stop putting on ice if it does not help with the pain.  If you were given a shoulder sling or immobilizer: ? Wear it as told. ? Remove it to shower or bathe. ? Move your arm as little as possible. ? Keep your hand moving. This helps prevent swelling.  Contact a doctor if:  Your pain gets worse.  Medicine does not help your pain.  You have new pain in your arm, hand, or fingers. Get help right away if:  Your arm, hand, or fingers: ? Tingle. ? Are numb. ? Are swollen. ? Are painful. ? Turn white or blue. This information is not intended to replace advice given to you by your health care provider. Make sure you discuss any questions you have with your health care provider. Document Released: 08/13/2007 Document Revised: 10/21/2015 Document Reviewed: 06/19/2014 Elsevier Interactive Patient Education  [employer]2018 [employer] Inc.

## 2017-03-25 ENCOUNTER — Telehealth: Payer: Self-pay | Admitting: Family Medicine

## 2017-03-25 NOTE — Telephone Encounter (Signed)
Yes, and referral has been changed to the requested facility.

## 2017-03-25 NOTE — Telephone Encounter (Signed)
Copied from CRM [id number]. Topic: Referral - Request >> Mar 25, 2017  8:36 AM Louie BunPalacios Medina, Rosey Batheresa D wrote: Reason for CRM: Patient wife called and would like to talk to Dr. Sherie DonLada nurse about change patient referral to [facility]. His insurance will cover if he goes there. Please call patient wife back, thanks.

## 2017-03-28 NOTE — Assessment & Plan Note (Signed)
Check level and supplement if needed, may contribute to fatigue 

## 2017-03-28 NOTE — Assessment & Plan Note (Signed)
Check level and supplement if needed, may contribute to fatigue

## 2017-06-21 MED ORDER — SODIUM CHLORIDE FLUSH 0.9 % IV SOLN
INTRAVENOUS | Status: AC
  Filled 2017-06-21: qty 10

## 2017-07-25 MED ORDER — CALCIUM CHLORIDE 10 % IV SOLN
INTRAVENOUS | Status: AC
  Filled 2017-07-25: qty 10

## 2017-07-26 MED ORDER — SODIUM CHLORIDE FLUSH 0.9 % IV SOLN
INTRAVENOUS | Status: AC
  Filled 2017-07-26: qty 10

## 2017-08-03 MED ORDER — BUPIVACAINE HCL (PF) 0.5 % IJ SOLN
INTRAMUSCULAR | Status: AC
  Filled 2017-08-03: qty 30

## 2017-08-24 ENCOUNTER — Encounter: Payer: Self-pay | Admitting: Family Medicine

## 2017-08-24 ENCOUNTER — Ambulatory Visit (INDEPENDENT_AMBULATORY_CARE_PROVIDER_SITE_OTHER): Payer: BLUE CROSS/BLUE SHIELD | Admitting: Family Medicine

## 2017-08-24 VITALS — BP 106/70 | HR 52 | Temp 98.0°F | Resp 16 | Ht 63.25 in | Wt 171.1 lb

## 2017-08-24 DIAGNOSIS — E669 Obesity, unspecified: Secondary | ICD-10-CM | POA: Diagnosis not present

## 2017-08-24 DIAGNOSIS — Z0000 Encounter for general adult medical examination without abnormal findings: Secondary | ICD-10-CM

## 2017-08-24 NOTE — Assessment & Plan Note (Signed)
USPSTF grade A and B recommendations reviewed with patient; age-appropriate recommendations, preventive care, screening tests, etc discussed and encouraged; healthy living encouraged; see AVS for patient education given to patient  

## 2017-08-24 NOTE — Patient Instructions (Addendum)
Check out the information at familydoctor.org entitled "Nutrition for Weight Loss: What You Need to Know about Fad Diets" Try to lose between 1-2 pounds per week by taking in fewer calories and burning off more calories You can succeed by limiting portions, limiting foods dense in calories and fat, becoming more active, and drinking 8 glasses of water a day (64 ounces) Don't skip meals, especially breakfast, as skipping meals may alter your metabolism Do not use over-the-counter weight loss pills or gimmicks that claim rapid weight loss A healthy BMI (or body mass index) is between 18.5 and 24.9 You can calculate your ideal BMI at the [place] website [employer]fr   Obesity, Adult Obesity is the condition of having too much total body fat. Being overweight or obese means that your weight is greater than what is considered healthy for your body size. Obesity is determined by a measurement called BMI. BMI is an estimate of body fat and is calculated from height and weight. For adults, a BMI of 30 or higher is considered obese. Obesity can eventually lead to other health concerns and major illnesses, including:  Stroke.  Coronary artery disease (CAD).  Type 2 diabetes.  Some types of cancer, including cancers of the colon, breast, uterus, and gallbladder.  Osteoarthritis.  High blood pressure (hypertension).  High cholesterol.  Sleep apnea.  Gallbladder stones.  Infertility problems.  What are the causes? The main cause of obesity is taking in (consuming) more calories than your body uses for energy. Other factors that contribute to this condition may include:  Being born with genes that make you more likely to become obese.  Having a medical condition that causes obesity. These conditions include: ? Hypothyroidism. ? Polycystic ovarian syndrome (PCOS). ? Binge-eating disorder. ? Cushing syndrome.  Taking certain medicines,  such as steroids, antidepressants, and seizure medicines.  Not being physically active (sedentary lifestyle).  Living where there are limited places to exercise safely or buy healthy foods.  Not getting enough sleep.  What increases the risk? The following factors may increase your risk of this condition:  Having a family history of obesity.  Being a woman of African-American descent.  Being a man of Hispanic descent.  What are the signs or symptoms? Having excessive body fat is the main symptom of this condition. How is this diagnosed? This condition may be diagnosed based on:  Your symptoms.  Your medical history.  A physical exam. Your health care provider may measure: ? Your BMI. If you are an adult with a BMI between 25 and less than 30, you are considered overweight. If you are an adult with a BMI of 30 or higher, you are considered obese. ? The distances around your hips and your waist (circumferences). These may be compared to each other to help diagnose your condition. ? Your skinfold thickness. Your health care provider may gently pinch a fold of your skin and measure it.  How is this treated? Treatment for this condition often includes changing your lifestyle. Treatment may include some or all of the following:  Dietary changes. Work with your health care provider and a dietitian to set a weight-loss goal that is healthy and reasonable for you. Dietary changes may include eating: ? Smaller portions. A portion size is the amount of a particular food that is healthy for you to eat at one time. This varies from person to person. ? Low-calorie or low-fat options. ? More whole grains, fruits, and vegetables.  Regular physical activity. This may  include aerobic activity (cardio) and strength training.  Medicine to help you lose weight. Your health care provider may prescribe medicine if you are unable to lose 1 pound a week after 6 weeks of eating more healthily and  doing more physical activity.  Surgery. Surgical options may include gastric banding and gastric bypass. Surgery may be done if: ? Other treatments have not helped to improve your condition. ? You have a BMI of 40 or higher. ? You have life-threatening health problems related to obesity.  Follow these instructions at home:  Eating and drinking   Follow recommendations from your health care provider about what you eat and drink. Your health care provider may advise you to: ? Limit fast foods, sweets, and processed snack foods. ? Choose low-fat options, such as low-fat milk instead of whole milk. ? Eat 5 or more servings of fruits or vegetables every day. ? Eat at home more often. This gives you more control over what you eat. ? Choose healthy foods when you eat out. ? Learn what a healthy portion size is. ? Keep low-fat snacks on hand. ? Avoid sugary drinks, such as soda, fruit juice, iced tea sweetened with sugar, and flavored milk. ? Eat a healthy breakfast.  Drink enough water to keep your urine clear or pale yellow.  Do not go without eating for long periods of time (do not fast) or follow a fad diet. Fasting and fad diets can be unhealthy and even dangerous. Physical Activity  Exercise regularly, as told by your health care provider. Ask your health care provider what types of exercise are safe for you and how often you should exercise.  Warm up and stretch before being active.  Cool down and stretch after being active.  Rest between periods of activity. Lifestyle  Limit the time that you spend in front of your TV, computer, or video game system.  Find ways to reward yourself that do not involve food.  Limit alcohol intake to no more than 1 drink a day for nonpregnant women and 2 drinks a day for men. One drink equals 12 oz of beer, 5 oz of wine, or 1 oz of hard liquor. General instructions  Keep a weight loss journal to keep track of the food you eat and how much you  exercise you get.  Take over-the-counter and prescription medicines only as told by your health care provider.  Take vitamins and supplements only as told by your health care provider.  Consider joining a support group. Your health care provider may be able to recommend a support group.  Keep all follow-up visits as told by your health care provider. This is important. Contact a health care provider if:  You are unable to meet your weight loss goal after 6 weeks of dietary and lifestyle changes. This information is not intended to replace advice given to you by your health care provider. Make sure you discuss any questions you have with your health care provider. Document Released: 04/03/2004 Document Revised: 07/30/2015 Document Reviewed: 12/13/2014 Elsevier Interactive Patient Education  2018 [employer].  Health Maintenance, Male A healthy lifestyle and preventive care is important for your health and wellness. Ask your health care provider about what schedule of regular examinations is right for you. What should I know about weight and diet? Eat a Healthy Diet  Eat plenty of vegetables, fruits, whole grains, low-fat dairy products, and lean protein.  Do not eat a lot of foods high in solid fats, added  sugars, or salt.  Maintain a Healthy Weight Regular exercise can help you achieve or maintain a healthy weight. You should:  Do at least 150 minutes of exercise each week. The exercise should increase your heart rate and make you sweat (moderate-intensity exercise).  Do strength-training exercises at least twice a week.  Watch Your Levels of Cholesterol and Blood Lipids  Have your blood tested for lipids and cholesterol every 5 years starting at 48 years of age. If you are at high risk for heart disease, you should start having your blood tested when you are 48 years old. You may need to have your cholesterol levels checked more often if: ? Your lipid or cholesterol levels are  high. ? You are older than 48 years of age. ? You are at high risk for heart disease.  What should I know about cancer screening? Many types of cancers can be detected early and may often be prevented. Lung Cancer  You should be screened every year for lung cancer if: ? You are a current smoker who has smoked for at least 30 years. ? You are a former smoker who has quit within the past 15 years.  Talk to your health care provider about your screening options, when you should start screening, and how often you should be screened.  Colorectal Cancer  Routine colorectal cancer screening usually begins at 48 years of age and should be repeated every 5-10 years until you are 48 years old. You may need to be screened more often if early forms of precancerous polyps or small growths are found. Your health care provider may recommend screening at an earlier age if you have risk factors for colon cancer.  Your health care provider may recommend using home test kits to check for hidden blood in the stool.  A small camera at the end of a tube can be used to examine your colon (sigmoidoscopy or colonoscopy). This checks for the earliest forms of colorectal cancer.  Prostate and Testicular Cancer  Depending on your age and overall health, your health care provider may do certain tests to screen for prostate and testicular cancer.  Talk to your health care provider about any symptoms or concerns you have about testicular or prostate cancer.  Skin Cancer  Check your skin from head to toe regularly.  Tell your health care provider about any new moles or changes in moles, especially if: ? There is a change in a mole's size, shape, or color. ? You have a mole that is larger than a pencil eraser.  Always use sunscreen. Apply sunscreen liberally and repeat throughout the day.  Protect yourself by wearing long sleeves, pants, a wide-brimmed hat, and sunglasses when outside.  What should I know  about heart disease, diabetes, and high blood pressure?  If you are 9-72 years of age, have your blood pressure checked every 3-5 years. If you are 37 years of age or older, have your blood pressure checked every year. You should have your blood pressure measured twice-once when you are at a hospital or clinic, and once when you are not at a hospital or clinic. Record the average of the two measurements. To check your blood pressure when you are not at a hospital or clinic, you can use: ? An automated blood pressure machine at a pharmacy. ? A home blood pressure monitor.  Talk to your health care provider about your target blood pressure.  If you are between 53-92 years old, ask your  health care provider if you should take aspirin to prevent heart disease.  Have regular diabetes screenings by checking your fasting blood sugar level. ? If you are at a normal weight and have a low risk for diabetes, have this test once every three years after the age of 48. ? If you are overweight and have a high risk for diabetes, consider being tested at a younger age or more often.  A one-time screening for abdominal aortic aneurysm (AAA) by ultrasound is recommended for men aged 65-75 years who are current or former smokers. What should I know about preventing infection? Hepatitis B If you have a higher risk for hepatitis B, you should be screened for this virus. Talk with your health care provider to find out if you are at risk for hepatitis B infection. Hepatitis C Blood testing is recommended for:  Everyone born from [id number] through 1966.  Anyone with known risk factors for hepatitis C.  Sexually Transmitted Diseases (STDs)  You should be screened each year for STDs including gonorrhea and chlamydia if: ? You are sexually active and are younger than 48 years of age. ? You are older than 48 years of age and your health care provider tells you that you are at risk for this type of infection. ? Your  sexual activity has changed since you were last screened and you are at an increased risk for chlamydia or gonorrhea. Ask your health care provider if you are at risk.  Talk with your health care provider about whether you are at high risk of being infected with HIV. Your health care provider may recommend a prescription medicine to help prevent HIV infection.  What else can I do?  Schedule regular health, dental, and eye exams.  Stay current with your vaccines (immunizations).  Do not use any tobacco products, such as cigarettes, chewing tobacco, and e-cigarettes. If you need help quitting, ask your health care provider.  Limit alcohol intake to no more than 2 drinks per day. One drink equals 12 ounces of beer, 5 ounces of wine, or 1 ounces of hard liquor.  Do not use street drugs.  Do not share needles.  Ask your health care provider for help if you need support or information about quitting drugs.  Tell your health care provider if you often feel depressed.  Tell your health care provider if you have ever been abused or do not feel safe at home. This information is not intended to replace advice given to you by your health care provider. Make sure you discuss any questions you have with your health care provider. Document Released: 08/23/2007 Document Revised: 10/24/2015 Document Reviewed: 11/28/2014 Elsevier Interactive Patient Education  [employer]2018 Elsevier Inc.

## 2017-08-24 NOTE — Progress Notes (Signed)
BP 106/70   Pulse (!) 52   Temp 98 F (36.7 C)   Resp 16   Ht 5' 3.25" (1.607 m)   Wt 171 lb 1.6 oz (77.6 kg)   SpO2 97%   BMI 30.07 kg/m    Subjective:    Patient ID: Carl Mcfarland, male    DOB: [birth year], 47 y.o.   MRN: [id number]  HPI: Carl Mcfarland is a 47 y.o. male  Chief Complaint  Patient presents with  . Annual Exam    HPI Here for physical  USPSTF grade A and B recommendations Depression:  Dep[facility] 2[facility]9 08/24/2017 03/23/2017 08/20/2016 04/26/2015  Decreased Interest 0 0 0 0  Down, Depressed, Hopeless 0 0 0 1  PHQ - 2 Score 0 0 0 1   Hypertension: BP Readings from Last 3 Encounters:  08/24/17 106/70  03/23/17 112/72  08/20/16 94/62   Obesity: Wt Readings from Last 3 Encounters:  08/24/17 171 lb 1.6 oz (77.6 kg)  03/23/17 178 lb 1.6 oz (80.8 kg)  09/17/16 176 lb (79.8 kg)   BMI Readings from Last 3 Encounters:  08/24/17 30.07 kg/m  03/23/17 30.86 kg/m  09/17/16 30.50 kg/m    Immunizations: tetanus UTD; flu shots yearly Skin cancer: no worrisome moles Lung cancer:  nonsmoker Prostate cancer: start at age 48; no symptoms No results found for: PSA Colorectal cancer: no blood in the stool; no fam hx of colon cancer AAA: n/a Aspirin: n/a Diet: fruits and veggies discussed Exercise: lots of walking on job, 14 miles one day  Alcohol: no Tobacco use: nonsmoker HIV, hep B, hep C: not interested STD testing and prevention (chl/gon/syphilis): not intersted Glucose: check today Glucose  Date Value Ref Range Status  04/26/2015 80 65 - 99 mg/dL Final  40/98/119101/12/2011 478136 (H) 65 - 99 mg/dL Final   Glucose, Bld  Date Value Ref Range Status  08/20/2016 78 65 - 99 mg/dL Final   Lipids:  Lab Results  Component Value Date   CHOL 189 08/20/2016   CHOL 220 (H) 04/26/2015   Lab Results  Component Value Date   HDL 42 08/20/2016   HDL 49 04/26/2015   Lab Results  Component Value Date   LDLCALC 124 (H) 08/20/2016   LDLCALC 152 (H) 04/26/2015    Lab Results  Component Value Date   TRIG 114 08/20/2016   TRIG 95 04/26/2015   Lab Results  Component Value Date   CHOLHDL 4.5 08/20/2016   No results found for: LDLDIRECT   [facility] 08/24/2017 03/23/2017 08/20/2016 04/26/2015  Decreased Interest 0 0 0 0  Down, Depressed, Hopeless 0 0 0 1  PHQ - 2 Score 0 0 0 1    Relevant past medical, surgical, family and social history reviewed Past Medical History:  Diagnosis Date  . GERD (gastroesophageal reflux disease)   . History of kidney stones   . Low testosterone   . Vitamin B12 deficiency   . Vitamin D deficiency disease    Past Surgical History:  Procedure Laterality Date  . GANGLION CYST EXCISION Right 03/20/2016   Procedure: REMOVAL GANGLION OF WRIST;  Surgeon: Kennedy BuckerMichael Menz, MD;  Location: [facility] ORS;  Service: Orthopedics;  Laterality: Right;  . HERNIA REPAIR     umbilical  . KNEE SURGERY Right Dec. 2010   Family History  Problem Relation Age of Onset  . Diabetes Mother   . Heart attack Maternal Grandfather   . Dementia Paternal Grandfather    Social History  Tobacco Use  . Smoking status: Never Smoker  . Smokeless tobacco: Never Used  Substance Use Topics  . Alcohol use: No  . Drug use: No    Comment: past use     [facility] Visit from 08/24/2017 in [facility]  AUDIT-C Score  0     Interim medical history since last visit reviewed. Allergies and medications reviewed  Review of Systems Per HPI unless specifically indicated above     Objective:    BP 106/70   Pulse (!) 52   Temp 98 F (36.7 C)   Resp 16   Ht 5' 3.25" (1.607 m)   Wt 171 lb 1.6 oz (77.6 kg)   SpO2 97%   BMI 30.07 kg/m   Wt Readings from Last 3 Encounters:  08/24/17 171 lb 1.6 oz (77.6 kg)  03/23/17 178 lb 1.6 oz (80.8 kg)  09/17/16 176 lb (79.8 kg)    Physical Exam  Constitutional: He appears well-developed and well-nourished. No distress.  HENT:  Head: Normocephalic and atraumatic.  Nose:  Nose normal.  Mouth/Throat: Oropharynx is clear and moist.  Eyes: EOM are normal. No scleral icterus.  Neck: No JVD present. No thyromegaly present.  Cardiovascular: Regular rhythm and normal heart sounds.  No extrasystoles are present. Bradycardia present.  No murmur heard. Pulmonary/Chest: Effort normal and breath sounds normal. No respiratory distress. He has no wheezes. He has no rales.  Abdominal: Soft. Bowel sounds are normal. He exhibits no distension. There is no tenderness. There is no guarding.  Musculoskeletal: Normal range of motion. He exhibits no edema.  Lymphadenopathy:    He has no cervical adenopathy.  Neurological: He is alert. He displays normal reflexes. He exhibits normal muscle tone. Coordination normal.  Skin: Skin is warm and dry. No rash noted. He is not diaphoretic. No erythema. No pallor.  Psychiatric: He has a normal mood and affect. His behavior is normal. Judgment and thought content normal.      Assessment & Plan:   Problem List Items Addressed This Visit      Other   Preventative health care - Primary    USPSTF grade A and B recommendations reviewed with patient; age-appropriate recommendations, preventive care, screening tests, etc discussed and encouraged; healthy living encouraged; see AVS for patient education given to patient       Relevant Orders   COMPLETE METABOLIC PANEL WITH GFR   CBC with Differential/Platelet   Lipid panel   TSH   Obesity (BMI 30.0-34.9)    Encouraged patient on his weight loss and asked him to try to lose five more pounds          Follow up plan: Return in about 1 year (around 08/25/2018) for complete physical.  An after-visit summary was printed and given to the patient at check-out.  Please see the patient instructions which may contain other information and recommendations beyond what is mentioned above in the assessment and plan.  No orders of the defined types were placed in this encounter.   Orders Placed  This Encounter  Procedures  . COMPLETE METABOLIC PANEL WITH GFR  . CBC with Differential/Platelet  . Lipid panel  . TSH

## 2017-08-24 NOTE — Assessment & Plan Note (Signed)
Encouraged patient on his weight loss and asked him to try to lose five more pounds

## 2017-12-08 LAB — CBC WITH DIFFERENTIAL/PLATELET
Basophils Absolute: 51 cells/uL (ref 0–200)
Basophils Relative: 1 %
EOS PCT: 1.2 %
Eosinophils Absolute: 61 cells/uL (ref 15–500)
HCT: 44.3 % (ref 38.5–50.0)
Hemoglobin: 15.7 g/dL (ref 13.2–17.1)
Lymphs Abs: 1943 cells/uL (ref 850–3900)
MCH: 31.4 pg (ref 27.0–33.0)
MCHC: 35.4 g/dL (ref 32.0–36.0)
MCV: 88.6 fL (ref 80.0–100.0)
MONOS PCT: 9 %
MPV: 9.5 fL (ref 7.5–12.5)
NEUTROS PCT: 50.7 %
Neutro Abs: 2586 cells/uL (ref 1500–7800)
PLATELETS: 204 10*3/uL (ref 140–400)
RBC: 5 10*6/uL (ref 4.20–5.80)
RDW: 13 % (ref 11.0–15.0)
TOTAL LYMPHOCYTE: 38.1 %
WBC mixed population: 459 cells/uL (ref 200–950)
WBC: 5.1 10*3/uL (ref 3.8–10.8)

## 2017-12-08 LAB — COMPLETE METABOLIC PANEL WITH GFR
AG Ratio: 1.8 (calc) (ref 1.0–2.5)
ALBUMIN MSPROF: 4.4 g/dL (ref 3.6–5.1)
ALKALINE PHOSPHATASE (APISO): 67 U/L (ref 40–115)
ALT: 21 U/L (ref 9–46)
AST: 17 U/L (ref 10–40)
BILIRUBIN TOTAL: 0.4 mg/dL (ref 0.2–1.2)
BUN: 22 mg/dL (ref 7–25)
CHLORIDE: 104 mmol/L (ref 98–110)
CO2: 27 mmol/L (ref 20–32)
Calcium: 9.5 mg/dL (ref 8.6–10.3)
Creat: 0.89 mg/dL (ref 0.60–1.35)
GFR, Est African American: 117 mL/min/{1.73_m2} (ref >=60)
GFR, Est Non African American: 101 mL/min/{1.73_m2} (ref >=60)
GLOBULIN: 2.5 g/dL (ref 1.9–3.7)
Glucose, Bld: 80 mg/dL (ref 65–139)
POTASSIUM: 4.3 mmol/L (ref 3.5–5.3)
SODIUM: 138 mmol/L (ref 135–146)
Total Protein: 6.9 g/dL (ref 6.1–8.1)

## 2017-12-08 LAB — LIPID PANEL
CHOL/HDL RATIO: 3.8 (calc) (ref <5.0)
Cholesterol: 199 mg/dL (ref <200)
HDL: 52 mg/dL (ref >40)
LDL CHOLESTEROL (CALC): 126 mg/dL — AB
Non-HDL Cholesterol (Calc): 147 mg/dL (calc) — ABNORMAL HIGH (ref <130)
Triglycerides: 104 mg/dL (ref <150)

## 2017-12-08 LAB — TSH: TSH: 1.72 m[IU]/L (ref 0.40–4.50)

## 2018-08-17 ENCOUNTER — Encounter: Payer: BLUE CROSS/BLUE SHIELD | Admitting: Family Medicine

## 2018-08-27 ENCOUNTER — Encounter: Payer: BLUE CROSS/BLUE SHIELD | Admitting: Family Medicine

## 2018-08-27 ENCOUNTER — Encounter: Payer: Self-pay | Admitting: Family Medicine

## 2019-02-21 ENCOUNTER — Other Ambulatory Visit: Payer: Self-pay

## 2019-02-21 DIAGNOSIS — Z20822 Contact with and (suspected) exposure to covid-19: Secondary | ICD-10-CM

## 2019-02-22 LAB — NOVEL CORONAVIRUS, NAA: SARS-CoV-2, NAA: NOT DETECTED

## 2019-03-28 ENCOUNTER — Ambulatory Visit: Payer: Self-pay | Attending: Internal Medicine

## 2019-03-28 DIAGNOSIS — Z20822 Contact with and (suspected) exposure to covid-19: Secondary | ICD-10-CM

## 2019-03-28 DIAGNOSIS — U071 COVID-19: Secondary | ICD-10-CM | POA: Insufficient documentation

## 2019-03-29 LAB — NOVEL CORONAVIRUS, NAA: SARS-CoV-2, NAA: DETECTED — AB

## 2019-04-06 ENCOUNTER — Emergency Department
Admission: EM | Admit: 2019-04-06 | Discharge: 2019-04-06 | Disposition: A | Discharge status: Home / Self Care | Payer: Self-pay | Attending: Emergency Medicine | Admitting: Emergency Medicine

## 2019-04-06 ENCOUNTER — Other Ambulatory Visit: Payer: Self-pay

## 2019-04-06 ENCOUNTER — Emergency Department: Payer: Self-pay

## 2019-04-06 DIAGNOSIS — U071 COVID-19: Secondary | ICD-10-CM | POA: Insufficient documentation

## 2019-04-06 LAB — LACTIC ACID, PLASMA: Lactic Acid, Venous: 1.2 mmol/L (ref 0.5–1.9)

## 2019-04-06 LAB — CBC WITH DIFFERENTIAL/PLATELET
Abs Immature Granulocytes: 0.05 10*3/uL (ref 0.00–0.07)
Basophils Absolute: 0 10*3/uL (ref 0.0–0.1)
Basophils Relative: 0 %
Eosinophils Absolute: 0 10*3/uL (ref 0.0–0.5)
Eosinophils Relative: 0 %
HCT: 44.9 % (ref 39.0–52.0)
Hemoglobin: 15.7 g/dL (ref 13.0–17.0)
Immature Granulocytes: 1 %
Lymphocytes Relative: 18 %
Lymphs Abs: 1.4 10*3/uL (ref 0.7–4.0)
MCH: 30.8 pg (ref 26.0–34.0)
MCHC: 35 g/dL (ref 30.0–36.0)
MCV: 88.2 fL (ref 80.0–100.0)
Monocytes Absolute: 0.5 10*3/uL (ref 0.1–1.0)
Monocytes Relative: 7 %
Neutro Abs: 5.8 10*3/uL (ref 1.7–7.7)
Neutrophils Relative %: 74 %
Platelets: 162 10*3/uL (ref 150–400)
RBC: 5.09 MIL/uL (ref 4.22–5.81)
RDW: 12.6 % (ref 11.5–15.5)
WBC: 7.7 10*3/uL (ref 4.0–10.5)
nRBC: 0 % (ref 0.0–0.2)

## 2019-04-06 LAB — BASIC METABOLIC PANEL
Anion gap: 8 (ref 5–15)
BUN: 19 mg/dL (ref 6–20)
CO2: 24 mmol/L (ref 22–32)
Calcium: 8.5 mg/dL — ABNORMAL LOW (ref 8.9–10.3)
Chloride: 106 mmol/L (ref 98–111)
Creatinine, Ser: 0.91 mg/dL (ref 0.61–1.24)
GFR calc Af Amer: >60 mL/min (ref >60)
GFR calc non Af Amer: >60 mL/min (ref >60)
Glucose, Bld: 115 mg/dL — ABNORMAL HIGH (ref 70–99)
Potassium: 3.8 mmol/L (ref 3.5–5.1)
Sodium: 138 mmol/L (ref 135–145)

## 2019-04-06 MED ORDER — ALBUTEROL SULFATE HFA 108 (90 BASE) MCG/ACT IN AERS
2.0000 | INHALATION_SPRAY | Freq: Once | RESPIRATORY_TRACT | Status: AC
  Administered 2019-04-06: 2 via RESPIRATORY_TRACT
  Filled 2019-04-06: qty 6.7

## 2019-04-06 MED ORDER — DEXAMETHASONE SODIUM PHOSPHATE 10 MG/ML IJ SOLN
10.0000 mg | Freq: Once | INTRAMUSCULAR | Status: AC
  Administered 2019-04-06: 10 mg via INTRAVENOUS
  Filled 2019-04-06: qty 1

## 2019-04-06 MED ORDER — PREDNISONE 20 MG PO TABS: 40.0000 mg | ORAL_TABLET | Freq: Every day | ORAL | 0 refills | Status: AC

## 2019-04-06 MED ORDER — KETOROLAC TROMETHAMINE 30 MG/ML IJ SOLN
15.0000 mg | Freq: Once | INTRAMUSCULAR | Status: AC
  Administered 2019-04-06: 15 mg via INTRAVENOUS
  Filled 2019-04-06: qty 1

## 2019-04-06 MED ORDER — AZITHROMYCIN 250 MG PO TABS: ORAL_TABLET | ORAL | 0 refills | Status: DC

## 2019-04-06 MED ORDER — HYDROCODONE-HOMATROPINE 5-1.5 MG/5ML PO SYRP: 5.0000 mL | ORAL_SOLUTION | Freq: Four times a day (QID) | ORAL | 0 refills | Status: DC | PRN

## 2019-04-06 NOTE — ED Provider Notes (Signed)
[facility] Emergency Department Provider Note  ____________________________________________   First MD Initiated Contact with Patient 04/06/19 1140     (approximate)  I have reviewed the triage vital signs and the nursing notes.   HISTORY  Chief Complaint Shortness of Breath    HPI Carl Mcfarland is a 49 y.o. male  Here with cough, fever. Pt has known COVID-19, diagnosed as an outpatient. He states that his sx began approx 1 week ago as mild cough, sore throat, chills. He initially felt somewhat better but over the past 2-3 days, has had worsening cough, mild SOB. No n/v/d. No ongoing high fevers. He states that his SOB is mostly during coughing spells but also w/ exertion. He states his children and wife all have COVID as well, and are generally doing well. He does not smoke. No h/o asthma or COPD.        Past Medical History:  Diagnosis Date  . GERD (gastroesophageal reflux disease)   . History of kidney stones   . Low testosterone   . Vitamin B12 deficiency   . Vitamin D deficiency disease     Patient Active Problem List   Diagnosis Date Noted  . Colon cancer screening 08/20/2016  . Obesity (BMI 30.0-34.9) 08/20/2016  . Preventative health care 05/05/2015  . Nail abnormality 04/26/2015  . Tinea pedis 04/26/2015  . Sleep apnea   . GERD (gastroesophageal reflux disease)   . Calculus of kidney   . Vitamin D deficiency disease   . Vitamin B12 deficiency   . Low testosterone     Past Surgical History:  Procedure Laterality Date  . GANGLION CYST EXCISION Right 03/20/2016   Procedure: REMOVAL GANGLION OF WRIST;  Surgeon: Kennedy Bucker, MD;  Location: [facility] ORS;  Service: Orthopedics;  Laterality: Right;  . HERNIA REPAIR     umbilical  . KNEE SURGERY Right Dec. 2010    Prior to Admission medications   Medication Sig Start Date End Date Taking? Authorizing Provider  Ascorbic Acid (VITAMIN C PO) Take by mouth daily.    [provider]  azithromycin (ZITHROMAX Z-PAK) 250 MG tablet Take 2 tablets (500 mg) on  Day 1,  followed by 1 tablet (250 mg) once daily on Days 2 through 5. 04/06/19   Shaune Pollack, MD  HYDROcodone-homatropine [facility]) 5-1.5 MG/5ML syrup Take 5 mLs by mouth every 6 (six) hours as needed for cough. 04/06/19   Shaune Pollack, MD  ibuprofen (ADVIL,MOTRIN) 600 MG tablet Take 1 tablet (600 mg total) by mouth every 8 (eight) hours as needed. Take with food 03/23/17   Lada, Janit Bern, MD  predniSONE (DELTASONE) 20 MG tablet Take 2 tablets (40 mg total) by mouth daily for 5 days. 04/06/19 04/11/19  Shaune Pollack, MD    Allergies Patient has no known allergies.  Family History  Problem Relation Age of Onset  . Diabetes Mother   . Heart attack Maternal Grandfather   . Dementia Paternal Grandfather     Social History Social History   Tobacco Use  . Smoking status: Never Smoker  . Smokeless tobacco: Never Used  Substance Use Topics  . Alcohol use: No  . Drug use: No    Comment: past use    Review of Systems  Review of Systems  Constitutional: Positive for fatigue. Negative for chills.  HENT: Negative for sore throat.   Respiratory: Positive for cough and shortness of breath.   Cardiovascular: Negative for chest pain.  Gastrointestinal: Negative for  abdominal pain.  Genitourinary: Negative for flank pain.  Musculoskeletal: Positive for arthralgias. Negative for neck pain.  Skin: Negative for rash and wound.  Allergic/Immunologic: Negative for immunocompromised state.  Neurological: Negative for weakness and numbness.  Hematological: Does not bruise/bleed easily.  All other systems reviewed and are negative.    ____________________________________________  PHYSICAL EXAM:      VITAL SIGNS: ED Triage Vitals  Enc Vitals Group     BP 04/06/19 1108 118/87     Pulse Rate 04/06/19 1108 79     Resp 04/06/19 1108 18     Temp 04/06/19 1108 99.4 F (37.4 C)     Temp Source 04/06/19  1108 Oral     SpO2 04/06/19 1108 98 %     Weight 04/06/19 1109 175 lb (79.4 kg)     Height 04/06/19 1109 5\' 1"  (1.549 m)     Head Circumference --      Peak Flow --      Pain Score 04/06/19 1109 9     Pain Loc --      Pain Edu? --      Excl. in [place]? --      Physical Exam Vitals and nursing note reviewed.  Constitutional:      General: He is not in acute distress.    Appearance: He is well-developed.  HENT:     Head: Normocephalic and atraumatic.  Eyes:     Conjunctiva/sclera: Conjunctivae normal.  Cardiovascular:     Rate and Rhythm: Normal rate and regular rhythm.     Heart sounds: Normal heart sounds. No murmur. No friction rub.  Pulmonary:     Effort: Pulmonary effort is normal. No respiratory distress.     Breath sounds: Normal breath sounds. No wheezing or rales.     Comments: Occasional rhonchi that clear with coughing. Normal WOB, speaking in full sentences. Abdominal:     General: There is no distension.     Palpations: Abdomen is soft.     Tenderness: There is no abdominal tenderness.  Musculoskeletal:     Cervical back: Neck supple.  Skin:    General: Skin is warm.     Capillary Refill: Capillary refill takes less than 2 seconds.  Neurological:     Mental Status: He is alert and oriented to person, place, and time.     Motor: No abnormal muscle tone.       ____________________________________________   LABS (all labs ordered are listed, but only abnormal results are displayed)  Labs Reviewed  BASIC METABOLIC PANEL - Abnormal; Notable for the following components:      Result Value   Glucose, Bld 115 (*)    Calcium 8.5 (*)    All other components within normal limits  CBC WITH DIFFERENTIAL/PLATELET  LACTIC ACID, PLASMA  LACTIC ACID, PLASMA    ____________________________________________  EKG: None ________________________________________  RADIOLOGY All imaging, including plain films, CT scans, and ultrasounds, independently reviewed by me,  and interpretations confirmed via formal radiology reads.  ED MD interpretation:   CXR: Possible mild interstitial changes  Official radiology report(s): DG Chest 2 View  Result Date: 04/06/2019 CLINICAL DATA:  Shortness of breath, cough, COVID positive 1/18 EXAM: CHEST - 2 VIEW COMPARISON:  None. FINDINGS: Mild interstitial prominence and ill-defined patchy density. No pleural effusion or pneumothorax. Cardiomediastinal contours are within normal limits. There is no acute osseous abnormality. IMPRESSION: Mild interstitial changes, which may reflect atypical pneumonia. Electronically Signed   By: Addison Lank.D.  On: 04/06/2019 12:07    ____________________________________________  PROCEDURES   Procedure(s) performed (including Critical Care):  Procedures  ____________________________________________  INITIAL IMPRESSION / MDM / ASSESSMENT AND PLAN / ED COURSE  As part of my medical decision making, I reviewed the following data within the electronic MEDICAL RECORD NUMBER Nursing notes reviewed and incorporated, Old chart reviewed, Notes from prior ED visits, and [place] Controlled Substance Database       *Brodric Bohdan Macho was evaluated in Emergency Department on 04/06/2019 for the symptoms described in the history of present illness. He was evaluated in the context of the global COVID-19 pandemic, which necessitated consideration that the patient might be at risk for infection with the SARS-CoV-2 virus that causes COVID-19. Institutional protocols and algorithms that pertain to the evaluation of patients at risk for COVID-19 are in a state of rapid change based on information released by regulatory bodies including the CDC and federal and state organizations. These policies and algorithms were followed during the patient's care in the ED.  Some ED evaluations and interventions may be delayed as a result of limited staffing during the pandemic.*     Medical Decision Making:  49 yo M  here with cough, worsening SOB in setting of COVID-19. CXR shows mild PNA. He is not hypoxic and is otherwise well appearing. No DM, COPD, asthma, or high risk features. Will treat supportively and provide steroids, azithro given reported high fevers and timeline c/f superimposed infection. Return precautions given.  ____________________________________________  FINAL CLINICAL IMPRESSION(S) / ED DIAGNOSES  Final diagnoses:  COVID-19     MEDICATIONS GIVEN DURING THIS VISIT:  Medications  dexamethasone (DECADRON) injection 10 mg (10 mg Intravenous Given 04/06/19 1301)  ketorolac (TORADOL) 30 MG/ML injection 15 mg (15 mg Intravenous Given 04/06/19 1301)  albuterol (VENTOLIN HFA) 108 (90 Base) MCG/ACT inhaler 2 puff (2 puffs Inhalation Given 04/06/19 1300)     ED Discharge Orders         Ordered    predniSONE (DELTASONE) 20 MG tablet  Daily     04/06/19 1433    azithromycin (ZITHROMAX Z-PAK) 250 MG tablet     04/06/19 1433    HYDROcodone-homatropine (HYCODAN) 5-1.5 MG/5ML syrup  Every 6 hours PRN     04/06/19 1433           Note:  This document was prepared using Dragon voice recognition software and may include unintentional dictation errors.   Shaune Pollack, MD 04/06/19 [phone]

## 2019-04-06 NOTE — ED Triage Notes (Signed)
Pt states he has been sick with cough, congestion , fever and increased SOB over the past 2 weeks and was dx with covid 1/18. Pt has unlabored breathing on arrival, in NAD,.

## 2019-04-11 ENCOUNTER — Ambulatory Visit: Payer: Self-pay | Attending: Internal Medicine

## 2019-04-11 DIAGNOSIS — Z20822 Contact with and (suspected) exposure to covid-19: Secondary | ICD-10-CM | POA: Insufficient documentation

## 2019-04-12 LAB — NOVEL CORONAVIRUS, NAA: SARS-CoV-2, NAA: NOT DETECTED

## 2019-06-13 ENCOUNTER — Ambulatory Visit: Payer: Self-pay | Attending: Internal Medicine

## 2019-06-13 DIAGNOSIS — Z23 Encounter for immunization: Secondary | ICD-10-CM

## 2019-06-13 NOTE — Progress Notes (Signed)
   Covid-19 Vaccination Clinic  Name:  Carl Mcfarland    MRN: [id number] DOB: 01/08/1971  06/13/2019  [name] was observed post Covid-19 immunization for 15 minutes without incident. He was provided with Vaccine Information Sheet and instruction to access the V-Safe system.   [name] was instructed to call 911 with any severe reactions post vaccine: [name] Kitchen Difficulty breathing  . Swelling of face and throat  . A fast heartbeat  . A bad rash all over body  . Dizziness and weakness   Immunizations Administered    Name Date Dose VIS Date Route   Pfizer COVID-19 Vaccine 06/13/2019  8:32 AM 0.3 mL 02/18/2019 Intramuscular   Manufacturer: [employer], [employer]   Lot: [id number]   NDC: [address]-7

## 2019-07-06 ENCOUNTER — Ambulatory Visit: Payer: Self-pay | Attending: Internal Medicine

## 2019-07-06 DIAGNOSIS — Z23 Encounter for immunization: Secondary | ICD-10-CM

## 2019-07-06 NOTE — Progress Notes (Signed)
   Covid-19 Vaccination Clinic  Name:  Carl Mcfarland    MRN: [id number] DOB: [birth year]  07/06/2019  [name] was observed post Covid-19 immunization for 15 minutes without incident. He was provided with Vaccine Information Sheet and instruction to access the V-Safe system.   [name] was instructed to call 911 with any severe reactions post vaccine: [name] Kitchen Difficulty breathing  . Swelling of face and throat  . A fast heartbeat  . A bad rash all over body  . Dizziness and weakness   Immunizations Administered    Name Date Dose VIS Date Route   Pfizer COVID-19 Vaccine 07/06/2019  9:36 AM 0.3 mL 05/04/2018 Intramuscular   Manufacturer: [employer], [employer]   Lot: [id number]   NDC: [address]-5

## 2019-07-13 ENCOUNTER — Ambulatory Visit (INDEPENDENT_AMBULATORY_CARE_PROVIDER_SITE_OTHER): Payer: No Typology Code available for payment source | Admitting: Internal Medicine

## 2019-07-13 ENCOUNTER — Encounter: Payer: Self-pay | Admitting: Internal Medicine

## 2019-07-13 ENCOUNTER — Other Ambulatory Visit: Payer: Self-pay

## 2019-07-13 VITALS — BP 110/78 | HR 72 | Temp 97.1°F | Resp 16 | Ht 63.0 in | Wt 168.9 lb

## 2019-07-13 DIAGNOSIS — Z1159 Encounter for screening for other viral diseases: Secondary | ICD-10-CM

## 2019-07-13 DIAGNOSIS — Z0000 Encounter for general adult medical examination without abnormal findings: Secondary | ICD-10-CM

## 2019-07-13 DIAGNOSIS — Z13220 Encounter for screening for lipoid disorders: Secondary | ICD-10-CM | POA: Diagnosis not present

## 2019-07-13 DIAGNOSIS — K219 Gastro-esophageal reflux disease without esophagitis: Secondary | ICD-10-CM | POA: Diagnosis not present

## 2019-07-13 DIAGNOSIS — G4733 Obstructive sleep apnea (adult) (pediatric): Secondary | ICD-10-CM

## 2019-07-13 DIAGNOSIS — Z125 Encounter for screening for malignant neoplasm of prostate: Secondary | ICD-10-CM

## 2019-07-13 DIAGNOSIS — E663 Overweight: Secondary | ICD-10-CM

## 2019-07-13 DIAGNOSIS — Z1329 Encounter for screening for other suspected endocrine disorder: Secondary | ICD-10-CM

## 2019-07-13 NOTE — Patient Instructions (Signed)

## 2019-07-13 NOTE — Progress Notes (Signed)
Name: Carl Mcfarland   MRN: [id number]    DOB: 07/01/1970   Date:07/13/2019       Progress Note  Subjective  Chief Complaint  Chief Complaint  Patient presents with  . Annual Exam    HPI  Patient presents for annual CPE . Patient was Covid positive in January. Completely recovered. Had vaccine - 2 doses, last was 07/06/19.  Patient with known sleep apnea, had a sleep study done in May 2017. Recommended CPAP but never obtained, not use. Notes only was problematic when ate before bedtime and now careful. Has a h/o GERD, manages with diet and is better, occas uses TUMS, about 2X month Denied daytime somnolence, wife notes sometimes snores.  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  USPSTF grade A and B recommendations:  Diet:  notes tries to watch and eat healthy, lots of vegetables, some fish, some fruits, no fast food or junk food, trying to lose weight and has been successful Exercise: + regular exercise, runs 3-4 times a week, no sx's with exercise  Depression: phq 9 is negative Depression screen [facility] 2/9 07/13/2019 08/24/2017 03/23/2017 08/20/2016 04/26/2015  Decreased Interest 0 0 0 0 0  Down, Depressed, Hopeless 0 0 0 0 1  PHQ - 2 Score 0 0 0 0 1    Hypertension:  BP Readings from Last 3 Encounters:  07/13/19 110/78  04/06/19 104/66  08/24/17 106/70    Obesity: Wt Readings from Last 3 Encounters:  07/13/19 168 lb 14.4 oz (76.6 kg)  04/06/19 175 lb (79.4 kg)  08/24/17 171 lb 1.6 oz (77.6 kg)   BMI Readings from Last 3 Encounters:  07/13/19 29.92 kg/m  04/06/19 33.07 kg/m  08/24/17 30.07 kg/m    Trying to lose weight presently.  Lipids:  Lab Results  Component Value Date   CHOL 199 08/24/2017   CHOL 189 08/20/2016   CHOL 220 (H) 04/26/2015   Lab Results  Component Value Date   HDL 52 08/24/2017   HDL 42 08/20/2016   HDL 49 04/26/2015   Lab Results  Component Value Date   LDLCALC 126 (H) 08/24/2017   LDLCALC 124 (H)  08/20/2016   LDLCALC 152 (H) 04/26/2015   Lab Results  Component Value Date   TRIG 104 08/24/2017   TRIG 114 08/20/2016   TRIG 95 04/26/2015   Lab Results  Component Value Date   CHOLHDL 3.8 08/24/2017   CHOLHDL 4.5 08/20/2016   No results found for: LDLDIRECT  Glucose: No h/o DM Glucose  Date Value Ref Range Status  03/20/2011 136 (H) 65 - 99 mg/dL Final   Glucose, Bld  Date Value Ref Range Status  04/06/2019 115 (H) 70 - 99 mg/dL Final  40/98/1191 80 65 - 139 mg/dL Final    Comment:    .        Non-fasting reference interval .   08/20/2016 78 65 - 99 mg/dL Final      Office Visit from 07/13/2019 in [facility]  AUDIT-C Score  0     Denies alcohol use Never smoker  Married STD testing and prevention (HIV/chl/gon/syphilis): Patient not feel needed presently Hep C: Patient agreed to have the hepatitis C antibody screening  Skin cancer: No concerns noted by patient in the recent past, Discussed monitoring for atypical lesions, following ABCDE's  Colorectal cancer: No FH, No dark stools, BRBPR, no chronic abd pains.  Discussed colonoscopy versus Cologuard for screening, he is 2  weeks away from age 49 and I noted the recommendations did lessen to start screening after age 74.  He wants to check if his insurance will cover the Cologuard test and then proceed with that if they do.  He will let us know. Prostate cancer: No FH, not up overnight to pee, no hesitancy, no urgency, no frequency. Discussed PSA screening and its role, he thought he may have had 1 in the past, could not find in the results, wants to do to help follow and will order today.   Lung cancer:  N/A-  Low Dose CT Chest recommended if Age 50-80 years, 20 pack-year currently smoking OR have quit w/in 15years. Stop screening once a person has not smoked for 15 years or has a health problem that limits life.Patient does not qualify.   AAA: N/A  - The USPSTF recommends one-time screening with  ultrasonography in men ages 68 to 63 years who have ever smoked ECG:  No concerning sx's, No CP's, palp's, SOB, no h/o heart disease.  Did recommend getting a baseline EKG, and the reasons why noted, and he would prefer not to do today.  Advanced Care Planning: A voluntary discussion about advance care planning including the explanation and discussion of advance directives.  Discussed health care proxy and Living will, and the patient was able to identify a health care proxy as wife or older son.  Patient does have a living will at present time. If patient does have living will, I have requested they bring this to the clinic to be scanned in to their chart.  Patient Active Problem List   Diagnosis Date Noted  . Colon cancer screening 08/20/2016  . Obesity (BMI 30.0-34.9) 08/20/2016  . Preventative health care 05/05/2015  . Nail abnormality 04/26/2015  . Tinea pedis 04/26/2015  . Sleep apnea   . GERD (gastroesophageal reflux disease)   . Calculus of kidney   . Vitamin D deficiency disease   . Vitamin B12 deficiency   . Low testosterone     Past Surgical History:  Procedure Laterality Date  . GANGLION CYST EXCISION Right 03/20/2016   Procedure: REMOVAL GANGLION OF WRIST;  Surgeon: Kennedy Bucker, MD;  Location: [facility] ORS;  Service: Orthopedics;  Laterality: Right;  . HERNIA REPAIR     umbilical  . KNEE SURGERY Right Dec. 2010    Family History  Problem Relation Age of Onset  . Diabetes Mother   . Heart attack Maternal Grandfather   . Dementia Paternal Grandfather     Social History   Socioeconomic History  . Marital status: Married    Spouse name: Not on file  . Number of children: Not on file  . Years of education: Not on file  . Highest education level: Not on file  Occupational History  . Not on file  Tobacco Use  . Smoking status: Never Smoker  . Smokeless tobacco: Never Used  Substance and Sexual Activity  . Alcohol use: No  . Drug use: No    Comment: past use  .  Sexual activity: Not Currently  Other Topics Concern  . Not on file  Social History Narrative  . Not on file   Social Determinants of Health   Financial Resource Strain: Low Risk   . Difficulty of Paying Living Expenses: Not hard at all  Food Insecurity: No Food Insecurity  . Worried About Programme researcher, broadcasting/film/video in the Last Year: Never true  . Ran Out of Food in the Last Year:  Never true  Transportation Needs: No Transportation Needs  . Lack of Transportation (Medical): No  . Lack of Transportation (Non-Medical): No  Physical Activity: Sufficiently Active  . Days of Exercise per Week: 4 days  . Minutes of Exercise per Session: 60 min  Stress: No Stress Concern Present  . Feeling of Stress : Not at all  Social Connections: Slightly Isolated  . Frequency of Communication with Friends and Family: Three times a week  . Frequency of Social Gatherings with Friends and Family: Twice a week  . Attends Religious Services: More than 4 times per year  . Active Member of Clubs or Organizations: No  . Attends Banker Meetings: Never  . Marital Status: Married  Catering manager Violence: Not At Risk  . Fear of Current or Ex-Partner: No  . Emotionally Abused: No  . Physically Abused: No  . Sexually Abused: No     Current Outpatient Medications:  .  Ascorbic Acid (VITAMIN C PO), Take by mouth daily., Disp: , Rfl:  .  azithromycin (ZITHROMAX Z-PAK) 250 MG tablet, Take 2 tablets (500 mg) on  Day 1,  followed by 1 tablet (250 mg) once daily on Days 2 through 5. (Patient not taking: Reported on 07/13/2019), Disp: 6 each, Rfl: 0 .  HYDROcodone-homatropine (HYCODAN) 5-1.5 MG/5ML syrup, Take 5 mLs by mouth every 6 (six) hours as needed for cough. (Patient not taking: Reported on 07/13/2019), Disp: 120 mL, Rfl: 0 .  ibuprofen (ADVIL,MOTRIN) 600 MG tablet, Take 1 tablet (600 mg total) by mouth every 8 (eight) hours as needed. Take with food (Patient not taking: Reported on 07/13/2019), Disp: 30  tablet, Rfl: 1  No Known Allergies   ROS: As noted above in HPI denies any recent unintentional weight loss, no increased fatigue No recent fevers, did have Covid in January, and has had the vaccine No increase headaches, vision changes, did note has a cataract, and will be having surgery in approximately 1 month No CP, palpitations,  No increased SOB,  No increased cough,  No persistent abdominal pains or change in bowel habits,  No rectal bleeding or dark/black stools,  No flank pains or dysuria,  No frequency or urgency No lower extremity swelling,  No increase joint aches or muscle aches,  No numbness, tingling or weakness in the extremities Denies other specific complaints on systems review except as noted in HPI    Objective  Vitals:   07/13/19 0808  BP: 110/78  Pulse: 72  Resp: 16  Temp: (!) 97.1 F (36.2 C)  SpO2: 100%  Weight: 168 lb 14.4 oz (76.6 kg)  Height: 5\' 3"  (1.6 m)    Body mass index is 29.92 kg/m.  NAD, very pleasant HEENT - sclera anicteric, PERRL, EOMI, conj - non-inj'ed, No sinus tenderness, TM's and canals clear, pharynx clear Neck - supple, no adenopathy, no TM, no JVD, carotids 2+ and = without bruits bilat Car - RRR without m/g/r Pulm- CTA without wheeze or rales Abd - soft, NT, ND, BS+, no obvious HSM, no masses Back - no CVA tenderness, no spinal tenderness Skin- no new lesions of concern on exposed areas, denied otherwise Ext - no LE edema, no active joints GU - no swelling in inguinal/suprapubic region, NT  Normal descended testes bilaterally, no masses palpated, no hernias, no lesions, no discharge Rectal:  Prostate nontender, no fluctuance, no nodules, 1+ in size, (slightly enlarged) No external hemorrhoids Neuro - affect was not flat, appropriate with conversation  CN's II-XII  intact  Grossly non-focal with good strength on testing, sensation intact to LT in distal extremities, DTR's 2+ and = patella, Romberg neg, no pronator  drift, good balance on one foot, good finger to nose, good tandem walk with normal gait   No results found for this or any previous visit (from the past 2160 hour(s)).   PHQ2/9: Depression screen [facility] 2/9 07/13/2019 08/24/2017 03/23/2017 08/20/2016 04/26/2015  Decreased Interest 0 0 0 0 0  Down, Depressed, Hopeless 0 0 0 0 1  PHQ - 2 Score 0 0 0 0 1   neg  Fall Risk: Fall Risk  07/13/2019 08/24/2017 03/23/2017 08/20/2016  Falls in the past year? 0 No No No  Number falls in past yr: 0 - - -  Injury with Fall? 0 - - -   neg   Functional Status Survey: Is the patient deaf or have difficulty hearing?: No Does the patient have difficulty seeing, even when wearing glasses/contacts?: No Does the patient have difficulty concentrating, remembering, or making decisions?: No Does the patient have difficulty walking or climbing stairs?: No Does the patient have difficulty dressing or bathing?: No Does the patient have difficulty doing errands alone such as visiting a doctor's office or shopping?: No neg   Assessment & Plan  General Health/Health Maintenance:   -Prostate cancer screening and PSA options (with potential risks and benefits of testing vs not testing) were discussed along with recent recs/guidelines.  -USPSTF grade A and B recommendations reviewed with patient; age-appropriate recommendations, preventive care, screening tests, etc discussed and encouraged; healthy living encouraged; see AVS for any further patient education given to patient  -Discussed importance of regular physical activity weekly, and he does so presently and will continue  -Discussed importance of eating healthy diet: He is doing a good job presently eating healthy  -Reviewed Health Maintenance: Will be due for a tetanus shot in 2022 Adult vaccines due  Topic Date Due  . TETANUS/TDAP  03/10/2020   1. Encounter for hepatitis C screening test for low risk patient Agreed to having the hepatitis C antibody  today. - Hepatitis C antibody  2. Screening for lipid disorders Recheck of his lipid panel ordered, LDL was slightly high previously I did note that to him today - Lipid panel  3. Gastroesophageal reflux disease without esophagitis Symptoms well controlled with dietary management.  Does use an occasional Tums product.  - COMPLETE METABOLIC PANEL WITH GFR - CBC with Differential/Platelet  4. Obstructive sleep apnea syndrome Has not used his CPAP, and he does not feel it is needed.  Denies any concerning daytime somnolence, and continue to monitor.  5. Overweight (BMI 25.0-29.9) He has had recent success losing weight, and encouraged.  The importance of regular physical activity and dietary modifications over time to help with weight management emphasized - COMPLETE METABOLIC PANEL WITH GFR  6. Screening for prostate cancer As above, will check a PSA today, and did note its role in screening for prostate cancer over time - PSA  7. Annual physical exam  - COMPLETE METABOLIC PANEL WITH GFR - Urinalysis, Complete  8. Screening for thyroid disorder We will check a TSH today as well. - TSH

## 2019-07-14 ENCOUNTER — Other Ambulatory Visit: Payer: Self-pay | Admitting: Internal Medicine

## 2019-07-14 DIAGNOSIS — E782 Mixed hyperlipidemia: Secondary | ICD-10-CM

## 2019-07-14 LAB — URINALYSIS, COMPLETE
Bacteria, UA: NONE SEEN /HPF
Bilirubin Urine: NEGATIVE
Glucose, UA: NEGATIVE
Hgb urine dipstick: NEGATIVE
Hyaline Cast: NONE SEEN /LPF
Ketones, ur: NEGATIVE
Leukocytes,Ua: NEGATIVE
Nitrite: NEGATIVE
Protein, ur: NEGATIVE
Specific Gravity, Urine: 1.023 (ref 1.001–1.03)
Squamous Epithelial / HPF: NONE SEEN /HPF (ref <=5)
WBC, UA: NONE SEEN /HPF (ref 0–5)
pH: 6.5 (ref 5.0–8.0)

## 2019-07-14 LAB — LIPID PANEL
Cholesterol: 253 mg/dL — ABNORMAL HIGH (ref <200)
HDL: 57 mg/dL (ref >=40)
LDL Cholesterol (Calc): 168 mg/dL (calc) — ABNORMAL HIGH
Non-HDL Cholesterol (Calc): 196 mg/dL (calc) — ABNORMAL HIGH (ref <130)
Total CHOL/HDL Ratio: 4.4 (calc) (ref <5.0)
Triglycerides: 141 mg/dL (ref <150)

## 2019-07-14 LAB — CBC WITH DIFFERENTIAL/PLATELET
Absolute Monocytes: 414 cells/uL (ref 200–950)
Basophils Absolute: 42 cells/uL (ref 0–200)
Basophils Relative: 0.7 %
Eosinophils Absolute: 78 cells/uL (ref 15–500)
Eosinophils Relative: 1.3 %
HCT: 50.2 % — ABNORMAL HIGH (ref 38.5–50.0)
Hemoglobin: 16.7 g/dL (ref 13.2–17.1)
Lymphs Abs: 2274 cells/uL (ref 850–3900)
MCH: 31.2 pg (ref 27.0–33.0)
MCHC: 33.3 g/dL (ref 32.0–36.0)
MCV: 93.8 fL (ref 80.0–100.0)
MPV: 10 fL (ref 7.5–12.5)
Monocytes Relative: 6.9 %
Neutro Abs: 3192 cells/uL (ref 1500–7800)
Neutrophils Relative %: 53.2 %
Platelets: 228 10*3/uL (ref 140–400)
RBC: 5.35 10*6/uL (ref 4.20–5.80)
RDW: 13.3 % (ref 11.0–15.0)
Total Lymphocyte: 37.9 %
WBC: 6 10*3/uL (ref 3.8–10.8)

## 2019-07-14 LAB — TSH: TSH: 1.37 mIU/L (ref 0.40–4.50)

## 2019-07-14 LAB — COMPLETE METABOLIC PANEL WITH GFR
AG Ratio: 1.5 (calc) (ref 1.0–2.5)
ALT: 24 U/L (ref 9–46)
AST: 21 U/L (ref 10–40)
Albumin: 4.5 g/dL (ref 3.6–5.1)
Alkaline phosphatase (APISO): 68 U/L (ref 36–130)
BUN: 19 mg/dL (ref 7–25)
CO2: 29 mmol/L (ref 20–32)
Calcium: 9.7 mg/dL (ref 8.6–10.3)
Chloride: 104 mmol/L (ref 98–110)
Creat: 0.94 mg/dL (ref 0.60–1.35)
GFR, Est African American: 110 mL/min/{1.73_m2} (ref >=60)
GFR, Est Non African American: 95 mL/min/{1.73_m2} (ref >=60)
Globulin: 3 g/dL (calc) (ref 1.9–3.7)
Glucose, Bld: 93 mg/dL (ref 65–99)
Potassium: 4.4 mmol/L (ref 3.5–5.3)
Sodium: 139 mmol/L (ref 135–146)
Total Bilirubin: 0.4 mg/dL (ref 0.2–1.2)
Total Protein: 7.5 g/dL (ref 6.1–8.1)

## 2019-07-14 LAB — PSA: PSA: 0.5 ng/mL (ref <=4.0)

## 2019-07-14 LAB — HEPATITIS C ANTIBODY
Hepatitis C Ab: NONREACTIVE
SIGNAL TO CUT-OFF: 0.01 (ref <1.00)

## 2019-07-14 MED ORDER — ATORVASTATIN CALCIUM 10 MG PO TABS: 10.0000 mg | ORAL_TABLET | Freq: Every day | ORAL | 3 refills | Status: DC

## 2019-07-14 NOTE — Progress Notes (Signed)
The patient's labs were reviewed from yesterday's physical.  The lipid panel was significantly worsened, and Lipitor was recommended to start and prescribed.

## 2019-07-15 ENCOUNTER — Telehealth: Payer: Self-pay | Admitting: Internal Medicine

## 2019-07-15 NOTE — Telephone Encounter (Signed)
Copied from CRM [id number]. Topic: General - Other >> Jul 15, 2019  9:42 AM Tamela Oddi wrote: Reason for CRM: Patient's wife would like the nurse to call to explain some lab results.  CB# [phone]

## 2019-07-18 NOTE — Telephone Encounter (Signed)
Carl Mcfarland has spoken to patient and wife

## 2019-10-20 NOTE — Progress Notes (Deleted)
Patient is a 49 year old male Last visit was in May 2021 for an annual exam Follows up today  Hyperlipidemia Medication regimen-atorvastatin 10 mg daily Takes medication regularly Lab Results  Component Value Date   CHOL 253 (H) 07/13/2019   HDL 57 07/13/2019   LDLCALC 168 (H) 07/13/2019   TRIG 141 07/13/2019   CHOLHDL 4.4 07/13/2019   Denies myalgias  Gastroesophageal reflux disease without esophagitis Medication regimen-occasional Tums product  symptoms well controlled with dietary management.  4. Obstructive sleep apnea syndrome Has not used his CPAP, and he does not feel it is needed.  Denies any concerning daytime somnolence, and continue to monitor.  Overweight (BMI 25.0-29.9) Wt Readings from Last 3 Encounters:  07/13/19 168 lb 14.4 oz (76.6 kg)  04/06/19 175 lb (79.4 kg)  08/24/17 171 lb 1.6 oz (77.6 kg)    He has had recent success losing weight, and encouraged the importance of regular physical activity and dietary modifications over time to help with weight management emphasized   sleep apnea, had a sleep study done in May 2017. Recommended CPAP but never obtained, not use. Notes only was problematic when ate before bedtime and now careful. Denies daytime somnolence, does snore per his spouse  Colon cancer screening He wanted to check if his insurance will cover the Cologuard test and then proceed with that if they do was noted on last visit   NAD, very pleasant HEENT - sclera anicteric, PERRL, EOMI, conj - non-inj'ed, No sinus tenderness, TM's and canals clear, pharynx clear Neck - supple, no adenopathy, no TM, no JVD, carotids 2+ and = without bruits bilat Car - RRR without m/g/r Pulm- CTA without wheeze or rales Abd - soft, NT, ND, BS+, no obvious HSM, no masses Back - no CVA tenderness, no spinal tenderness Skin- no new lesions of concern on exposed areas, denied otherwise Ext - no LE edema, no active joints GU - no swelling in inguinal/suprapubic  region, NT  Normal descended testes bilaterally, no masses palpated, no hernias, no lesions, no discharge Rectal: Prostate nontender, no fluctuance, no nodules, 1+ in size, (slightly enlarged) No external hemorrhoids Neuro - affect was not flat, appropriate with conversation             CN's II-XII intact             Grossly non-focal with good strength on testing, sensation intact to LT in distal extremities, DTR's 2+ and = patella, Romberg neg, no pronator drift, good balance on one foot, good finger to nose, good tandem walk with normal gait

## 2019-10-24 ENCOUNTER — Ambulatory Visit: Payer: No Typology Code available for payment source | Admitting: Internal Medicine

## 2019-12-01 ENCOUNTER — Other Ambulatory Visit: Payer: Self-pay | Admitting: Orthopedic Surgery

## 2019-12-01 DIAGNOSIS — M25561 Pain in right knee: Secondary | ICD-10-CM

## 2019-12-01 DIAGNOSIS — M1711 Unilateral primary osteoarthritis, right knee: Secondary | ICD-10-CM

## 2019-12-19 ENCOUNTER — Other Ambulatory Visit: Payer: Self-pay

## 2019-12-19 ENCOUNTER — Ambulatory Visit
Admission: RE | Admit: 2019-12-19 | Discharge: 2019-12-19 | Disposition: A | Discharge status: Home / Self Care | Payer: No Typology Code available for payment source | Source: Ambulatory Visit | Attending: Orthopedic Surgery | Admitting: Orthopedic Surgery

## 2019-12-19 DIAGNOSIS — M25561 Pain in right knee: Secondary | ICD-10-CM

## 2019-12-19 DIAGNOSIS — M1711 Unilateral primary osteoarthritis, right knee: Secondary | ICD-10-CM

## 2020-02-13 ENCOUNTER — Other Ambulatory Visit: Payer: Self-pay | Admitting: Orthopedic Surgery

## 2020-02-15 ENCOUNTER — Other Ambulatory Visit: Payer: Self-pay

## 2020-02-15 ENCOUNTER — Encounter
Admission: RE | Admit: 2020-02-15 | Discharge: 2020-02-15 | Disposition: A | Discharge status: Home / Self Care | Payer: No Typology Code available for payment source | Source: Ambulatory Visit | Attending: Orthopedic Surgery | Admitting: Orthopedic Surgery

## 2020-02-15 DIAGNOSIS — Z01818 Encounter for other preprocedural examination: Secondary | ICD-10-CM | POA: Insufficient documentation

## 2020-02-15 NOTE — Patient Instructions (Addendum)
Your procedure is scheduled on:02-24-20 FRIDAY Report to the Registration Desk on the 1st floor of the [facility]-Then proceed to the 2nd floor Surgery Desk in the Medical [facility] To find out your arrival time, please call [phone] between 1PM - 3PM on:02-23-20 THURSDAY  REMEMBER: Instructions that are not followed completely may result in serious medical risk, up to and including death; or upon the discretion of your surgeon and anesthesiologist your surgery may need to be rescheduled.  Do not eat food after midnight the night before surgery.  No gum chewing, lozengers or hard candies.  You may however, drink CLEAR liquids up to 2 hours before you are scheduled to arrive for your surgery. Do not drink anything within 2 hours of your scheduled arrival time.  Clear liquids include: - water  - apple juice without pulp - gatorade  - black coffee or tea (Do NOT add milk or creamers to the coffee or tea) Do NOT drink anything that is not on this list.  In addition, your doctor has ordered for you to drink the provided  Ensure Pre-Surgery Clear Carbohydrate Drink  Drinking this carbohydrate drink up to two hours before surgery helps to reduce insulin resistance and improve patient outcomes. Please complete drinking 2 hours prior to scheduled arrival time.  TAKE THESE MEDICATIONS THE MORNING OF SURGERY WITH A SIP OF WATER: -PRILOSEC (OMEPRAZOLE)-take one the night before and one on the morning of surgery - helps to prevent nausea after surgery.)  One week prior to surgery: Stop Anti-inflammatories (NSAIDS) such as Advil, Aleve, Ibuprofen, Motrin, Naproxen, Naprosyn and Aspirin based products such as Excedrin, Goodys Powder, BC Powder-OK TO TAKE TYLENOL IF NEEDED  Stop ANY OVER THE COUNTER supplements until after surgery-STOP VITAMIN C-YOU MAY RESUME AFTER SURGERY (However, you may continue taking Vitamin D up until the day before surgery.)   No Alcohol for 24 hours before or after  surgery.  No Smoking including e-cigarettes for 24 hours prior to surgery.  No chewable tobacco products for at least 6 hours prior to surgery.  No nicotine patches on the day of surgery.  Do not use any "recreational" drugs for at least a week prior to your surgery.  Please be advised that the combination of cocaine and anesthesia may have negative outcomes, up to and including death. If you test positive for cocaine, your surgery will be cancelled.  On the morning of surgery brush your teeth with toothpaste and water, you may rinse your mouth with mouthwash if you wish. Do not swallow any toothpaste or mouthwash.  Do not wear jewelry, make-up, hairpins, clips or nail polish.  Do not wear lotions, powders, or perfumes.   Do not shave body from the neck down 48 hours prior to surgery just in case you cut yourself which could leave a site for infection.  Also, freshly shaved skin may become irritated if using the CHG soap.  Contact lenses, hearing aids and dentures may not be worn into surgery.  Do not bring valuables to the hospital. [facility] is not responsible for any missing/lost belongings or valuables.   Use CHG Soap as directed on instruction sheet.  Notify your doctor if there is any change in your medical condition (cold, fever, infection).  Wear comfortable clothing (specific to your surgery type) to the hospital.  Plan for stool softeners for home use; pain medications have a tendency to cause constipation. You can also help prevent constipation by eating foods high in fiber such as fruits  and vegetables and drinking plenty of fluids as your diet allows.  After surgery, you can help prevent lung complications by doing breathing exercises.  Take deep breaths and cough every 1-2 hours. Your doctor may order a device called an Incentive Spirometer to help you take deep breaths. When coughing or sneezing, hold a pillow firmly against your incision with both hands. This is  called "splinting." Doing this helps protect your incision. It also decreases belly discomfort.  If you are being admitted to the hospital overnight, leave your suitcase in the car. After surgery it may be brought to your room.  If you are being discharged the day of surgery, you will not be allowed to drive home. You will need a responsible adult (18 years or older) to drive you home and stay with you that night.   If you are taking public transportation, you will need to have a responsible adult (18 years or older) with you. Please confirm with your physician that it is acceptable to use public transportation.   Please call the Pre-admissions Testing Dept. at [phone] if you have any questions about these instructions.  Visitation Policy:  Patients undergoing a surgery or procedure may have one family member or support person with them as long as that person is not COVID-19 positive or experiencing its symptoms.  That person may remain in the waiting area during the procedure.  Inpatient Visitation Update:   In an effort to ensure the safety of our team members and our patients, we are implementing a change to our visitation policy:  Effective Monday, Aug. 9, at 7 a.m., inpatients will be allowed one support person.  o The support person may change daily.  o The support person must pass our screening, gel in and out, and wear a mask at all times, including in the patient's room.  o Patients must also wear a mask when staff or their support person are in the room.  o Masking is required regardless of vaccination status.  Systemwide, no visitors 17 or younger.

## 2020-02-22 ENCOUNTER — Other Ambulatory Visit
Admission: RE | Admit: 2020-02-22 | Discharge: 2020-02-22 | Disposition: A | Discharge status: Home / Self Care | Payer: No Typology Code available for payment source | Source: Ambulatory Visit | Attending: Orthopedic Surgery | Admitting: Orthopedic Surgery

## 2020-02-22 DIAGNOSIS — Z20822 Contact with and (suspected) exposure to covid-19: Secondary | ICD-10-CM | POA: Insufficient documentation

## 2020-02-22 DIAGNOSIS — Z01812 Encounter for preprocedural laboratory examination: Secondary | ICD-10-CM | POA: Insufficient documentation

## 2020-02-22 LAB — SARS CORONAVIRUS 2 (TAT 6-24 HRS): SARS Coronavirus 2: NEGATIVE

## 2020-02-24 ENCOUNTER — Ambulatory Visit
Admission: RE | Admit: 2020-02-24 | Discharge: 2020-02-24 | Disposition: A | Discharge status: Home / Self Care | Payer: No Typology Code available for payment source | Attending: Orthopedic Surgery | Admitting: Orthopedic Surgery

## 2020-02-24 ENCOUNTER — Other Ambulatory Visit: Payer: Self-pay

## 2020-02-24 ENCOUNTER — Encounter: Payer: Self-pay | Admitting: Orthopedic Surgery

## 2020-02-24 ENCOUNTER — Encounter
Admission: RE | Disposition: A | Discharge status: Home / Self Care | Payer: Self-pay | Source: Home / Self Care | Attending: Orthopedic Surgery

## 2020-02-24 ENCOUNTER — Ambulatory Visit: Payer: No Typology Code available for payment source | Admitting: Anesthesiology

## 2020-02-24 ENCOUNTER — Ambulatory Visit: Payer: No Typology Code available for payment source

## 2020-02-24 DIAGNOSIS — S83231A Complex tear of medial meniscus, current injury, right knee, initial encounter: Secondary | ICD-10-CM | POA: Insufficient documentation

## 2020-02-24 DIAGNOSIS — X58XXXA Exposure to other specified factors, initial encounter: Secondary | ICD-10-CM | POA: Insufficient documentation

## 2020-02-24 DIAGNOSIS — S83261A Peripheral tear of lateral meniscus, current injury, right knee, initial encounter: Secondary | ICD-10-CM | POA: Insufficient documentation

## 2020-02-24 DIAGNOSIS — S83511A Sprain of anterior cruciate ligament of right knee, initial encounter: Secondary | ICD-10-CM | POA: Diagnosis present

## 2020-02-24 DIAGNOSIS — Z79899 Other long term (current) drug therapy: Secondary | ICD-10-CM | POA: Insufficient documentation

## 2020-02-24 HISTORY — PX: KNEE ARTHROSCOPY WITH ANTERIOR CRUCIATE LIGAMENT (ACL) REPAIR WITH HAMSTRING GRAFT: SHX5645

## 2020-02-24 SURGERY — KNEE ARTHROSCOPY WITH ANTERIOR CRUCIATE LIGAMENT (ACL) REPAIR WITH HAMSTRING GRAFT
Anesthesia: General | Laterality: Right

## 2020-02-24 MED ORDER — OXYCODONE HCL 5 MG PO TABS: 5.0000 mg | ORAL_TABLET | Freq: Once | ORAL | Status: DC | PRN

## 2020-02-24 MED ORDER — HYDROMORPHONE HCL 1 MG/ML IJ SOLN
INTRAMUSCULAR | Status: DC | PRN
  Administered 2020-02-24: 1 mg via INTRAVENOUS

## 2020-02-24 MED ORDER — ORAL CARE MOUTH RINSE: 15.0000 mL | Freq: Once | OROMUCOSAL | Status: AC

## 2020-02-24 MED ORDER — ROCURONIUM BROMIDE 100 MG/10ML IV SOLN
INTRAVENOUS | Status: DC | PRN
  Administered 2020-02-24: 70 mg via INTRAVENOUS

## 2020-02-24 MED ORDER — FENTANYL CITRATE (PF) 100 MCG/2ML IJ SOLN
INTRAMUSCULAR | Status: AC
  Administered 2020-02-24: 25 ug via INTRAVENOUS
  Filled 2020-02-24: qty 2

## 2020-02-24 MED ORDER — HYDROMORPHONE HCL 1 MG/ML IJ SOLN
INTRAMUSCULAR | Status: AC
  Filled 2020-02-24: qty 1

## 2020-02-24 MED ORDER — DEXMEDETOMIDINE (PRECEDEX) IN NS 20 MCG/5ML (4 MCG/ML) IV SYRINGE
PREFILLED_SYRINGE | INTRAVENOUS | Status: AC
  Filled 2020-02-24: qty 5

## 2020-02-24 MED ORDER — MIDAZOLAM HCL 2 MG/2ML IJ SOLN
INTRAMUSCULAR | Status: DC | PRN
  Administered 2020-02-24: 2 mg via INTRAVENOUS

## 2020-02-24 MED ORDER — ONDANSETRON 4 MG PO TBDP: 4.0000 mg | ORAL_TABLET | Freq: Three times a day (TID) | ORAL | 0 refills | Status: DC | PRN

## 2020-02-24 MED ORDER — OXYCODONE HCL 5 MG PO TABS: 5.0000 mg | ORAL_TABLET | ORAL | 0 refills | Status: DC | PRN

## 2020-02-24 MED ORDER — ASPIRIN EC 325 MG PO TBEC: 325.0000 mg | DELAYED_RELEASE_TABLET | Freq: Every day | ORAL | 0 refills | Status: AC

## 2020-02-24 MED ORDER — FENTANYL CITRATE (PF) 100 MCG/2ML IJ SOLN
25.0000 ug | INTRAMUSCULAR | Status: DC | PRN
  Administered 2020-02-24: 25 ug via INTRAVENOUS

## 2020-02-24 MED ORDER — PROPOFOL 10 MG/ML IV BOLUS
INTRAVENOUS | Status: AC
  Filled 2020-02-24: qty 20

## 2020-02-24 MED ORDER — ACETAMINOPHEN 10 MG/ML IV SOLN: 1000.0000 mg | Freq: Once | INTRAVENOUS | Status: DC | PRN

## 2020-02-24 MED ORDER — LIDOCAINE HCL (PF) 2 % IJ SOLN
INTRAMUSCULAR | Status: AC
  Filled 2020-02-24: qty 5

## 2020-02-24 MED ORDER — KETAMINE HCL 50 MG/ML IJ SOLN
INTRAMUSCULAR | Status: DC | PRN
  Administered 2020-02-24: 100 mg via INTRAMUSCULAR

## 2020-02-24 MED ORDER — ROCURONIUM BROMIDE 10 MG/ML (PF) SYRINGE
PREFILLED_SYRINGE | INTRAVENOUS | Status: AC
  Filled 2020-02-24: qty 10

## 2020-02-24 MED ORDER — MIDAZOLAM HCL 2 MG/2ML IJ SOLN
INTRAMUSCULAR | Status: AC
  Filled 2020-02-24: qty 2

## 2020-02-24 MED ORDER — LIDOCAINE-EPINEPHRINE 1 %-1:100000 IJ SOLN
INTRAMUSCULAR | Status: DC | PRN
  Administered 2020-02-24: 5 mL

## 2020-02-24 MED ORDER — EPHEDRINE SULFATE 50 MG/ML IJ SOLN
INTRAMUSCULAR | Status: DC | PRN
  Administered 2020-02-24: 20 mg via INTRAVENOUS

## 2020-02-24 MED ORDER — ACETAMINOPHEN 500 MG PO TABS: 1000.0000 mg | ORAL_TABLET | Freq: Three times a day (TID) | ORAL | 2 refills | Status: DC

## 2020-02-24 MED ORDER — FENTANYL CITRATE (PF) 100 MCG/2ML IJ SOLN
INTRAMUSCULAR | Status: DC | PRN
  Administered 2020-02-24 (×2): 100 ug via INTRAVENOUS
  Administered 2020-02-24: 50 ug via INTRAVENOUS

## 2020-02-24 MED ORDER — FENTANYL CITRATE (PF) 250 MCG/5ML IJ SOLN
INTRAMUSCULAR | Status: AC
  Filled 2020-02-24: qty 5

## 2020-02-24 MED ORDER — CHLORHEXIDINE GLUCONATE 0.12 % MT SOLN
OROMUCOSAL | Status: AC
  Administered 2020-02-24: 15 mL via OROMUCOSAL
  Filled 2020-02-24: qty 15

## 2020-02-24 MED ORDER — BUPIVACAINE HCL (PF) 0.5 % IJ SOLN
INTRAMUSCULAR | Status: DC | PRN
  Administered 2020-02-24: 5 mL

## 2020-02-24 MED ORDER — DEXAMETHASONE SODIUM PHOSPHATE 10 MG/ML IJ SOLN
INTRAMUSCULAR | Status: AC
  Filled 2020-02-24: qty 1

## 2020-02-24 MED ORDER — CHLORHEXIDINE GLUCONATE 0.12 % MT SOLN: 15.0000 mL | Freq: Once | OROMUCOSAL | Status: AC

## 2020-02-24 MED ORDER — CEFAZOLIN SODIUM-DEXTROSE 2-4 GM/100ML-% IV SOLN
INTRAVENOUS | Status: AC
  Filled 2020-02-24: qty 100

## 2020-02-24 MED ORDER — ONDANSETRON HCL 4 MG/2ML IJ SOLN
INTRAMUSCULAR | Status: DC | PRN
  Administered 2020-02-24: 4 mg via INTRAVENOUS

## 2020-02-24 MED ORDER — KETOROLAC TROMETHAMINE 30 MG/ML IJ SOLN
INTRAMUSCULAR | Status: DC | PRN
  Administered 2020-02-24: 30 mg via INTRAVENOUS

## 2020-02-24 MED ORDER — KETAMINE HCL 50 MG/ML IJ SOLN
INTRAMUSCULAR | Status: AC
  Filled 2020-02-24: qty 10

## 2020-02-24 MED ORDER — DEXMEDETOMIDINE HCL IN NACL 400 MCG/100ML IV SOLN
INTRAVENOUS | Status: DC | PRN
  Administered 2020-02-24: 20 ug via INTRAVENOUS

## 2020-02-24 MED ORDER — ONDANSETRON HCL 4 MG/2ML IJ SOLN
INTRAMUSCULAR | Status: AC
  Filled 2020-02-24: qty 2

## 2020-02-24 MED ORDER — DEXAMETHASONE SODIUM PHOSPHATE 10 MG/ML IJ SOLN
INTRAMUSCULAR | Status: DC | PRN
  Administered 2020-02-24: 10 mg via INTRAVENOUS

## 2020-02-24 MED ORDER — PROPOFOL 10 MG/ML IV BOLUS
INTRAVENOUS | Status: DC | PRN
  Administered 2020-02-24: 150 mg via INTRAVENOUS

## 2020-02-24 MED ORDER — LACTATED RINGERS IV SOLN: INTRAVENOUS | Status: DC

## 2020-02-24 MED ORDER — OXYCODONE HCL 5 MG/5ML PO SOLN: 5.0000 mg | Freq: Once | ORAL | Status: DC | PRN

## 2020-02-24 MED ORDER — CEFAZOLIN SODIUM-DEXTROSE 2-4 GM/100ML-% IV SOLN
2.0000 g | INTRAVENOUS | Status: AC
  Administered 2020-02-24: 2 g via INTRAVENOUS

## 2020-02-24 MED ORDER — LIDOCAINE HCL (CARDIAC) PF 100 MG/5ML IV SOSY
PREFILLED_SYRINGE | INTRAVENOUS | Status: DC | PRN
  Administered 2020-02-24: 50 mg via INTRAVENOUS

## 2020-02-24 MED ORDER — ONDANSETRON HCL 4 MG/2ML IJ SOLN: 4.0000 mg | Freq: Once | INTRAMUSCULAR | Status: DC | PRN

## 2020-02-24 MED ORDER — IBUPROFEN 800 MG PO TABS: 800.0000 mg | ORAL_TABLET | Freq: Three times a day (TID) | ORAL | 1 refills | Status: AC

## 2020-02-24 SURGICAL SUPPLY — 94 items
"PENCIL ELECTRO HAND CTR " (MISCELLANEOUS) ×1 IMPLANT
ADAPTER IRRIG TUBE 2 SPIKE SOL (ADAPTER) ×4 IMPLANT
ADPR TBG 2 SPK PMP STRL ASCP (ADAPTER) ×2
ANCHOR BUTTON TIGHTROPE 14 (Button) ×1 IMPLANT
APL PRP STRL LF DISP 70% ISPRP (MISCELLANEOUS) ×1
BASIN GRAD PLASTIC 32OZ STRL (MISCELLANEOUS) ×2 IMPLANT
BLADE SURG 15 STRL LF DISP TIS (BLADE) ×2 IMPLANT
BLADE SURG 15 STRL SS (BLADE) ×4
BLADE SURG SZ10 CARB STEEL (BLADE) ×2 IMPLANT
BLADE SURG SZ11 CARB STEEL (BLADE) ×2 IMPLANT
BNDG COHESIVE 4X5 TAN STRL (GAUZE/BANDAGES/DRESSINGS) ×2 IMPLANT
BNDG COHESIVE 6X5 TAN STRL LF (GAUZE/BANDAGES/DRESSINGS) ×2 IMPLANT
BNDG ESMARK 6X12 TAN STRL LF (GAUZE/BANDAGES/DRESSINGS) ×2 IMPLANT
BRUSH SCRUB EZ  4% CHG (MISCELLANEOUS) ×1
BRUSH SCRUB EZ 4% CHG (MISCELLANEOUS) ×1 IMPLANT
BUR BR 5.5 12 FLUTE (BURR) IMPLANT
BUR RADIUS 4.0X18.5 (BURR) ×2 IMPLANT
CARTRIDGE SUT 2-0 NONSTITCH (Anchor) ×3 IMPLANT
CHLORAPREP W/TINT 26 (MISCELLANEOUS) ×2 IMPLANT
CLEANER CAUTERY TIP 5X5 PAD (MISCELLANEOUS) ×1 IMPLANT
COOLER POLAR GLACIER W/PUMP (MISCELLANEOUS) ×2 IMPLANT
COVER BACK TABLE REUSABLE LG (DRAPES) ×2 IMPLANT
COVER WAND RF STERILE (DRAPES) ×2 IMPLANT
CUFF TOURN SGL QUICK 24 (TOURNIQUET CUFF)
CUFF TOURN SGL QUICK 30 (TOURNIQUET CUFF)
CUFF TRNQT CYL 24X4X16.5-23 (TOURNIQUET CUFF) IMPLANT
CUFF TRNQT CYL 30X4X21-28X (TOURNIQUET CUFF) IMPLANT
CUTTER SUT KNOT PUSHER AIR (CUTTER) ×1 IMPLANT
DEVICE MENISCAL CVD UP (Anchor) ×10 IMPLANT
DRAPE 3/4 80X56 (DRAPES) ×4 IMPLANT
DRAPE ARTHRO LIMB 89X125 STRL (DRAPES) ×2 IMPLANT
DRAPE FLUOR MINI C-ARM 54X84 (DRAPES) ×2 IMPLANT
DRAPE POUCH INSTRU U-SHP 10X18 (DRAPES) ×2 IMPLANT
DRAPE SPLIT 6X30 W/TAPE (DRAPES) ×2 IMPLANT
DRILL FLIPCUTTER III 6-12 (ORTHOPEDIC DISPOSABLE SUPPLIES) IMPLANT
ELECT REM PT RETURN 9FT ADLT (ELECTROSURGICAL) ×2
ELECTRODE REM PT RTRN 9FT ADLT (ELECTROSURGICAL) ×1 IMPLANT
FLIPCUTTER III 6-12 AR-1204FF (ORTHOPEDIC DISPOSABLE SUPPLIES) ×2
GAUZE SPONGE 4X4 12PLY STRL (GAUZE/BANDAGES/DRESSINGS) ×2 IMPLANT
GAUZE XEROFORM 1X8 LF (GAUZE/BANDAGES/DRESSINGS) ×2 IMPLANT
GLOVE BIOGEL PI IND STRL 8 (GLOVE) ×1 IMPLANT
GLOVE BIOGEL PI INDICATOR 8 (GLOVE) ×1
GLOVE SURG SYN 8.0 (GLOVE) ×2 IMPLANT
GLOVE SURG SYN 8.0 PF PI (GLOVE) ×1 IMPLANT
GOWN STRL REUS W/ TWL LRG LVL3 (GOWN DISPOSABLE) ×1 IMPLANT
GOWN STRL REUS W/ TWL XL LVL3 (GOWN DISPOSABLE) ×1 IMPLANT
GOWN STRL REUS W/TWL LRG LVL3 (GOWN DISPOSABLE) ×2
GOWN STRL REUS W/TWL XL LVL3 (GOWN DISPOSABLE) ×2
GRADUATE 1200CC STRL 31836 (MISCELLANEOUS) ×2 IMPLANT
GUIDEWIRE 1.2MMX18 (WIRE) ×2 IMPLANT
HANDLE YANKAUER SUCT BULB TIP (MISCELLANEOUS) ×2 IMPLANT
IMPL TIGHTROP ABS ACL FIBERTG (Orthopedic Implant) IMPLANT
IMPL TIGHTROP FIBERTAG ACL (Orthopedic Implant) IMPLANT
IMPL TIGHTROPE ABS ACL FIBERTG (Orthopedic Implant) ×2 IMPLANT
IMPLANT TIGHTROPE FIBERTAG ACL (Orthopedic Implant) ×2 IMPLANT
IV LACTATED RINGER IRRG 3000ML (IV SOLUTION) ×12
IV LR IRRIG 3000ML ARTHROMATIC (IV SOLUTION) ×6 IMPLANT
KIT TRANSTIBIAL (DISPOSABLE) ×1 IMPLANT
KIT TURNOVER KIT A (KITS) ×2 IMPLANT
KNIFE BLADE PARALLEL SZ10 (BLADE) ×1 IMPLANT
KNIFE GRAFT ACL 10MM 5952 (MISCELLANEOUS) ×1 IMPLANT
MANAGER SUT NOVOCUT (CUTTER) ×5 IMPLANT
MANIFOLD NEPTUNE II (INSTRUMENTS) ×4 IMPLANT
MAT ABSORB  FLUID 56X50 GRAY (MISCELLANEOUS) ×2
MAT ABSORB FLUID 56X50 GRAY (MISCELLANEOUS) ×2 IMPLANT
NEEDLE HYPO 22GX1.5 SAFETY (NEEDLE) ×2 IMPLANT
NOVOSTICH PRO MENISCAL 2-0 (Miscellaneous) ×2 IMPLANT
PACK ARTHROSCOPY KNEE (MISCELLANEOUS) ×2 IMPLANT
PAD ABD DERMACEA PRESS 5X9 (GAUZE/BANDAGES/DRESSINGS) ×4 IMPLANT
PAD CLEANER CAUTERY TIP 5X5 (MISCELLANEOUS) ×1
PAD WRAPON POLAR KNEE (MISCELLANEOUS) ×1 IMPLANT
PENCIL ELECTRO HAND CTR (MISCELLANEOUS) ×2 IMPLANT
REAMER ARTHRO 9.5 SS (MISCELLANEOUS) ×1 IMPLANT
SET TUBE SUCT SHAVER OUTFL 24K (TUBING) ×2 IMPLANT
SET TUBE TIP INTRA-ARTICULAR (MISCELLANEOUS) ×2 IMPLANT
SPONGE LAP 18X18 RF (DISPOSABLE) ×4 IMPLANT
STRIP CLOSURE SKIN 1/2X4 (GAUZE/BANDAGES/DRESSINGS) ×2 IMPLANT
SUCTION FRAZIER HANDLE 10FR (MISCELLANEOUS)
SUCTION TUBE FRAZIER 10FR DISP (MISCELLANEOUS) IMPLANT
SUT ETHILON 3-0 FS-10 30 BLK (SUTURE) ×2
SUT FIBERWIRE #2 38 T-5 BLUE (SUTURE) ×4
SUT MNCRL AB 4-0 PS2 18 (SUTURE) ×2 IMPLANT
SUT VIC AB 0 CT1 36 (SUTURE) ×2 IMPLANT
SUT VIC AB 2-0 CT1 27 (SUTURE) ×2
SUT VIC AB 2-0 CT1 TAPERPNT 27 (SUTURE) ×1 IMPLANT
SUTURE EHLN 3-0 FS-10 30 BLK (SUTURE) ×1 IMPLANT
SUTURE FIBERWR #2 38 T-5 BLUE (SUTURE) ×2 IMPLANT
SYR BULB IRRIG 60ML STRL (SYRINGE) ×2 IMPLANT
SYS ANCHOR SUT W/2-0 BLU CO-BR (Anchor) IMPLANT
SYSTEM NVSTCH PRO MENISCAL 2-0 (Miscellaneous) IMPLANT
TRAY FOLEY SLVR 16FR LF STAT (SET/KITS/TRAYS/PACK) ×2 IMPLANT
TUBING ARTHRO INFLOW-ONLY STRL (TUBING) ×2 IMPLANT
WAND WEREWOLF FLOW 90D (MISCELLANEOUS) IMPLANT
WRAPON POLAR PAD KNEE (MISCELLANEOUS) ×2

## 2020-02-24 NOTE — Anesthesia Postprocedure Evaluation (Signed)
Anesthesia Post Note  Patient: Carl Mcfarland  Procedure(s) Performed: Right ACL reconstruction using quadriceps tendon autograft, medial and lateral meniscus repair vs partial meniscectomy and possible chondroplasty - Dedra Skeens to Assist (Right )  Patient location during evaluation: PACU Anesthesia Type: General Level of consciousness: awake and alert Pain management: pain level controlled Vital Signs Assessment: post-procedure vital signs reviewed and stable Respiratory status: spontaneous breathing, nonlabored ventilation, respiratory function stable and patient connected to nasal cannula oxygen Cardiovascular status: blood pressure returned to baseline and stable Postop Assessment: no apparent nausea or vomiting Anesthetic complications: no Comments: Patient's pain well controlled, rated 2/10. Will defer placing a nerve block after discussion with patient.   No complications documented.   Last Vitals:  Vitals:   02/24/20 1630 02/24/20 1655  BP: 105/62 108/61  Pulse: 69 70  Resp: 15 16  Temp: (!) 36.1 C (!) 36.4 C  SpO2: 94% 97%    Last Pain:  Vitals:   02/24/20 1655  TempSrc: Temporal  PainSc: 2                  Corinda Gubler

## 2020-02-24 NOTE — Op Note (Signed)
Operative Note    SURGERY DATE: 02/24/2020   PRE-OP DIAGNOSIS:  1. Right knee anterior cruciate ligament tear 2. Right medial meniscus tear 3. Right lateral meniscus tear   POST-OP DIAGNOSIS:  1. Right knee anterior cruciate ligament tear 2. Right medial meniscus tear 3. Right lateral meniscus tear  PROCEDURES:  1. Right knee anterior cruciate ligament reconstruction with quadriceps tendon autograft 2. Right medial meniscus repair 3. Right lateral meniscus repair   SURGEON: Rosealee Albee, MD  ASSISTANT: Dedra Skeens, PA   ANESTHESIA: Gen + regional   ESTIMATED BLOOD LOSS: 25cc   TOTAL IV FLUIDS: per anesthesia  INDICATION(S):  Carl Mcfarland is a 49 y.o. male who had a chronic ACL tear, medial meniscus tear, and lateral meniscus tear, which was consistent with the clinical exam. He cannot recall initial injury, but he had significantly worsening pain and instability sensations ~5 months ago. We discussed risks of surgery including but not limited to possible ACL and/or meniscus re-tear, infection, bleeding, muscle/nerve damage, DVT, complications of anesthesia, and postoperative knee pain and arthrofibrosis. After discussion of risks, benefits, and alternatives to surgery, the patient and family elected to proceed.   OPERATIVE FINDINGS:    Examination under anesthesia: A careful examination under anesthesia was performed.  Passive range of motion was: Hyperextension: 2.  Extension: 0.  Flexion: 140.  Lachman: 2B. Pivot Shift: grade 2.  Posterior drawer: normal.  Varus stability in full extension: normal.  Varus stability in 30 degrees of flexion: normal.  Valgus stability in full extension: normal.  Valgus stability in 30 degrees of flexion: normal.   Intra-operative findings: A thorough arthroscopic examination of the knee was performed.  The findings are: 1. Suprapatellar pouch: Normal 2. Undersurface of median ridge: Normal 3. Medial patellar facet: Normal 4. Lateral  patellar facet: Normal 5. Trochlea: Normal 6. Lateral gutter/popliteus tendon: Normal 7. Hoffa's fat pad: Normal 8. Medial gutter/plica: Normal 9. ACL: Abnormal: complete femoral avulsion 10. PCL: Normal 11. Medial meniscus: posterior horn vertical tear at the meniscocapsular junction and small oblique/parrot beak tear of the posterior horn/body junction exiting inferiorly 12. Medial compartment cartilage: Grade 1-2 degenerative changes to the femoral condyle, grade 1 changes to tibial plateau 13. Lateral meniscus: vertical tear at the red-white zone of the posterior horn;  14. Lateral compartment cartilage: Normal   OPERATIVE REPORT:    I identified Carl Mcfarland in the pre-operative holding area.  I marked the operative knee with my initials. I reviewed the risks and benefits of the proposed surgical intervention and the patient (and/or patient's guardian) wished to proceed.  Anesthesia was then performed with an adductor canal nerve block.  The patient was transferred to the operative suite and placed in the supine position with all bony prominences padded.  Care was taken to ensure that the contralateral leg was placed in neutral position and that the operative leg was well-padded in the leg holder.     Appropriate IV antibiotics were administered within 30 minutes of incision. The extremity was then prepped and draped in standard fashion. A time out was performed confirming the correct extremity, correct patient and correct procedure.   Given the clear presence of a pivot shift on examination under anesthesia, I first directed my attention to the harvest of a quadriceps autograft.  The right lower extremity was exsanguinated with an Esmarch, and a thigh tourniquet was elevated to 250 mmHg.  The total tourniquet time for this case was 130 minutes.     A  4cm incision was planned just proximal to the proximal pole of the patella.  The incision was made with a 15 blade, and subcutaneous  fat was sharply excised to expose the quadriceps tendon.  The paratenon was sharply incised, and the space in between the paratenon and the quadriceps tendon was bluntly developed with a sponge and a key elevator.  A speculum retractor was placed anteriorly, and the quadriceps was easily visualized with the arthroscope.  The vastus lateralis and VMO were clearly identified, as was the junction of the rectus femoris muscle with the proximal aspect of the quadriceps tendon.  Under direct visualization with the arthroscope, an Arthrex 10 mm parallel blade was used to incise the quadriceps tendon from its most proximal extent, to the junction with the patella.  Care was taken not to violate the rectus femoris muscle.  Then, using a 15 blade, the graft was transected and elevated distally off the patella, creating a 7 mm thick partial thickness graft.  Dissection was carried proximally to create a uniformly thick graft, 70 mm in length.  The distal end of the graft was controlled with a #2 Fiberwire stitch, and the Arthrex quadriceps harvester/cutter was loaded over the graft.  At a length of 70 mm, the harvest/cutter was used to transect the graft proximally, and the graft was removed from the wound.  The arthroscope was used to confirm a partial thickness harvest with no violation of the anterior knee capsule.     On the back table, the graft was prepared in standard fashion.  The length of the graft was 65 mm.  Each end was prepared using an Warden/rangerArthrex FiberTag whipstitch.  The femoral end was secured around a TightRope RT, and the tibial end was secured around an ABS loop.  The femoral end of the graft was 9 mm in diameter, the tibial end was 9.225mm in diameter.  The graft was tensioned to 20 lbs and reserved for later use.  The graft was placed in a graft tube and tensioned to 20 lbs and reserved for later use.  Of note, it was soaked in 5 mg/mL vancomycin solution to reduce risk of infection for at least 20 minutes  prior to implantation.   Standard anterolateral portals was created with an 11-blade.  The arthroscope was introduced through the anterolateral portal, and a full diagnostic arthroscopy was performed as described above.   An anteromedial portal was made under needle localization. A shaver was introduced through the anteromedial portal and used to gently debride the fat pad to improve visualization. Then the ACL remnant was debrided using the shaver, leaving 1-2 mm stumps on the tibia for anatomic referencing.  The lateral meniscus was addressed first. A rasp was used to roughen the capsular tissue about the meniscus and vertical split.  A Ceterix Novostitch device was used to pass suture across either side of the vertical tear. A knot was tied arthroscopically. One additional stitch was placed across the tear in a similar fashion. The tear was probed afterwards and found to be stable and anatomically reduced.   The medial meniscus was addressed next. The oblique/parrot beak component was debrided with an oscillating shaver. A rasp was used to roughen the capsular tissue about the meniscus. Five Stryker IVY Air all-inside suture device was used to repair the meniscus tear (3 on femoral side, 2 on tibial side).  The tear was probed afterwards and found to be stable and anatomically reduced.    Next, I created the  femoral socket. This was performed with an outside-in technique using an Teacher, English as a foreign language. The femoral guide was hooked around the back wall of the femur and the guide indicated where the lateral femoral incision should be. We made this incision with a 15 blade and followed the angle of the drill sleeve to make the incision through the IT band down to bone. The drill sleeve was pushed down to bone on the lateral femoral condyle and the guide was placed on the anatomic footprint of the ACL. We drilled an 8mm tunnel that was 14mm in length. We then used a FiberStick to pass a suture through the  femoral tunnel and out of the anteromedial portal.    I then directed my attention to preparation of the tibial tunnel. A tibial guide set at 55 degrees was inserted through the anteromedial portal and centered over the tibial footprint.  The drill sleeve was then advanced to the proximal medial tibia just at the junction of the tibial tubercle and the pes tendon, through a ~4cm incision.  The anticipated tunnel length was 43mm.  A guide pin was then drilled through the proximal tibia under direct arthroscopic visualization into the center of the ACL footprint.  This was then over-reamed with an Arthrex 9.40mm reamer. Bony debris and soft tissue was cleared from the metaphyseal opening with electrocautery and from the intra-articular aperture with a shaver.   The passing suture was then brought out of the tibial tunnel.  The graft was then advanced into place in standard fashion.  The femoral Tight Rope was deployed on the lateral cortex under direct arthroscopic visualization from the anteromedial portal.  Correct position on the lateral cortex was confirmed fluoroscopically.  The TightRope was then shortened until at least 20 mm of graft was in the femoral tunnel.     I then directed my attention to tibial fixation.  This was performed with the knee in full extension with an axial and posterior drawer load applied to the tibia.  A 31mm ABS concave button was loaded over the ABS loop, and the loop was shortened until the button was flush with the anteromedial tibial cortex. The knee was then cycled 20 times, and both the femoral and tibial button were tightened as much as possible with the knee in full extension. The tibial sutures were tightened, and a knot was placed over the button.    A repeat examination under anesthesia was performed.  The patient retained full hyperextension and flexion.  The Lachman's was normalized.  The arthroscope was re-introduced into the knee joint, confirming excellent  position and tension of the quadriceps autograft.  There was no lateral wall or roof impingement.  Tibial and femoral sutures were cut.   The wounds were irrigated. 2-0 Vicryl was used to close the quadriceps paratenon.  0 Vicryl was used to close the sartorius fascia.  2-0 Vicryl was used to close the subdermal layers of both the quadriceps tendon harvest and the tibial tunnel incisions.  The harvest incision and the proximal medial tibial incisions were then closed with 4-0 Monocryl and Dermabond.  The arthroscopy portals and lateral femoral incision were closed with 3-0 Nylon.  A sterile dressing was applied, followed by a Polar Care device and a hinged knee brace locked in full extension.   The patient was awakened from anesthesia without difficulty and was transferred to the PACU in stable condition.   Of note, assistance from a PA was essential to performing the surgery.  PA  was present for the entire surgery.  PA assisted with patient positioning, retraction, instrumentation, and wound closure. The surgery would have been more difficult and had longer operative time without PA assistance.    POSTOPERATIVE PLAN: The patient will be discharged home today once they meet PACU criteria. FFWB x 4 weeks. WBAT with crutches from weeks 4-6. Aspirin 325 mg daily for 4 weeks for DVT prophylaxis. Start physical therapy on POD#3-4. Follow up in 2 weeks per protocol.

## 2020-02-24 NOTE — Discharge Instructions (Addendum)
Arthroscopic ACL Surgery with Meniscus Repair   Post-Op Instructions   1. Bracing or crutches: Crutches will be provided at the time of discharge from the surgery center.    2. Ice: You may be provided with a device [facility]) that allows you to ice the affected area effectively. Otherwise you can ice manually.   3. Driving:  Driving: Off all narcotic pain meds when operating vehicle   1 week for automatic cars, left leg surgery  4 weeks for standard/manual cars or right leg surgery   4. Activity: Ankle pumps several times an hour while awake to prevent blood clots. Weight bearing: foot flat weight bearing is permitted with brace locked in extension. The brace should not be unlocked in order to protect the meniscus repair. Unlock only for hygiene and for exercises as directed by physical therapist. Elevate knee above heart level as much as possible for one week. Avoid standing more than 5 minutes (consecutively) for the first week. No exercise involving the knee until cleared by the surgeon or physical therapist. Ideally, you should avoid long distance travel for 4 weeks.   5. Medications:  - You have been provided a prescription for narcotic pain medicine. After surgery, take 1-2 narcotic tablets every 4 hours if needed for severe pain.  - A prescription for anti-nausea medication will be provided in case the narcotic medicine causes nausea - take 1 tablet every 6 hours only if nauseated.  - Take ibuprofen 800 mg every 8 hours with food to reduce post-operative knee swelling. DO NOT STOP IBUPROFEN POST-OP UNTIL INSTRUCTED TO DO SO at first post-op office visit (10-14 days after surgery).  - Take enteric coated aspirin 325 mg once daily for 2 weeks to prevent blood clots.  - Take tylenol 1000mg  (two extra strength tablets) every 8 hours for pain.  May stop tylenol 5 days after surgery if you are having minimal pain. - You can take an over the counter stool softener to help with narcotic related  constipation (Colace, Senna, Miralax, etc.)   If you are taking prescription medication for anxiety, depression, insomnia, muscle spasm, chronic pain, or for attention deficit disorder you are advised that you are at a higher risk of adverse effects with use of narcotics post-op, including narcotic addiction/dependence, depressed breathing, death. If you use non-prescribed substances: alcohol, marijuana, cocaine, heroin, methamphetamines, etc., you are at a higher risk of adverse effects with use of narcotics post-op, including narcotic addiction/dependence, depressed breathing, death. You are advised that taking > 50 morphine milligram equivalents (MME) of narcotic pain medication per day results in twice the risk of overdose or death. For your prescription provided: oxycodone 5 mg - taking more than 6 tablets per day. Be advised that we will prescribe narcotics short-term, for acute post-operative pain only - 1 week for minor operations such as knee arthroscopy for meniscus tear resection, and 3 weeks for major operations such as knee repair/reconstruction surgeries.   6. Bandages: The physical therapist should change the bandages at the first post-op appointment. If needed, the dressing supplies have been provided to you.   7. Physical Therapy: 2 times per week for the first 4 weeks, then 1-2 times per week from weeks 4-8 post-op. Therapy typically starts on post operative Day 3 or 4. You have been provided an order for physical therapy. The therapist will provide home exercises.   8. Work/School: May return when able to tolerate standing for greater than 2 hours and off of narcotic pain medications.  Can return to school usually in ~1-2 weeks.    9. Post-Op Appointments: Your first post-op appointment will be with Dr. Allena Katz in approximately 2 weeks time.    If you find that they have not been scheduled please call the Orthopaedic Appointment front desk at [phone].   AMBULATORY SURGERY   DISCHARGE INSTRUCTIONS   1) The drugs that you were given will stay in your system until tomorrow so for the next 24 hours you should not:  A) Drive an automobile B) Make any legal decisions C) Drink any alcoholic beverage   2) You may resume regular meals tomorrow.  Today it is better to start with liquids and gradually work up to solid foods.  You may eat anything you prefer, but it is better to start with liquids, then soup and crackers, and gradually work up to solid foods.   3) Please notify your doctor immediately if you have any unusual bleeding, trouble breathing, redness and pain at the surgery site, drainage, fever, or pain not relieved by medication.  4) Your post-operative visit with Dr.                                     is: Date:                        Time:    Please call to schedule your post-operative visit.  5) Additional Instructions:

## 2020-02-24 NOTE — Progress Notes (Signed)
Patient having minimal pain.  Plan was for patient to have post op block per dr patel if he needed it.  Dr Suzan Slick at bedside to speak wit pt.  Since pt not having lot pain, pt decided to hold off on block.

## 2020-02-24 NOTE — Anesthesia Preprocedure Evaluation (Signed)
Anesthesia Evaluation  Patient identified by MRN, date of birth, ID band Patient awake    Reviewed: Allergy & Precautions, NPO status , Patient's Chart, lab work & pertinent test results  History of Anesthesia Complications Negative for: history of anesthetic complications  Airway Mallampati: III  TM Distance: >3 FB Neck ROM: Full    Dental no notable dental hx. (+) Teeth Intact   Pulmonary neg pulmonary ROS, neg sleep apnea, neg COPD,    Pulmonary exam normal breath sounds clear to auscultation- rhonchi (-) wheezing      Cardiovascular Exercise Tolerance: Good (-) hypertension(-) CAD and (-) Past MI  Rhythm:Regular Rate:Normal - Systolic murmurs and - Diastolic murmurs    Neuro/Psych negative neurological ROS  negative psych ROS   GI/Hepatic Neg liver ROS, GERD  ,  Endo/Other  negative endocrine ROSneg diabetes  Renal/GU Renal disease: hx of nephrolithiasis.     Musculoskeletal negative musculoskeletal ROS (+)   Abdominal (+) - obese,   Peds  Hematology negative hematology ROS (+)   Anesthesia Other Findings Past Medical History: No date: GERD (gastroesophageal reflux disease) No date: History of kidney stones No date: Low testosterone No date: Vitamin B12 deficiency No date: Vitamin D deficiency disease   Reproductive/Obstetrics                             Anesthesia Physical  Anesthesia Plan  ASA: II  Anesthesia Plan: General   Post-op Pain Management:  Regional for Post-op pain   Induction: Intravenous  PONV Risk Score and Plan: 2 and Ondansetron, Dexamethasone and Midazolam  Airway Management Planned: Oral ETT  Additional Equipment: None  Intra-op Plan:   Post-operative Plan: Extubation in OR  Informed Consent: I have reviewed the patients History and Physical, chart, labs and discussed the procedure including the risks, benefits and alternatives for the proposed  anesthesia with the patient or authorized representative who has indicated his/her understanding and acceptance.     Dental advisory given  Plan Discussed with: CRNA and Anesthesiologist  Anesthesia Plan Comments: (Discussed risks of anesthesia with patient, including PONV, sore throat, lip/dental damage. Rare risks discussed as well, such as cardiorespiratory and neurological sequelae. Patient understands.  Discussed r/b/a of adductor canal nerve block, including:  - bleeding, infection, nerve damage - poor or non functioning block. Patient understands. )        Anesthesia Quick Evaluation

## 2020-02-24 NOTE — H&P (Signed)
Paper H&P to be scanned into permanent record. H&P reviewed. No significant changes noted.  

## 2020-02-24 NOTE — Anesthesia Procedure Notes (Signed)
Procedure Name: Intubation Performed by: Gaynelle Cage, CRNA Pre-anesthesia Checklist: Patient identified, Emergency Drugs available, Suction available and Patient being monitored Patient Re-evaluated:Patient Re-evaluated prior to induction Oxygen Delivery Method: Circle system utilized Preoxygenation: Pre-oxygenation with 100% oxygen Induction Type: IV induction Ventilation: Mask ventilation without difficulty and Oral airway inserted - appropriate to patient size Laryngoscope Size: Mac and 3 Grade View: Grade II Tube type: Oral Tube size: 7.0 mm Number of attempts: 1 Airway Equipment and Method: Oral airway Placement Confirmation: ETT inserted through vocal cords under direct vision,  positive ETCO2 and breath sounds checked- equal and bilateral Secured at: 21 cm Tube secured with: Tape Dental Injury: Teeth and Oropharynx as per pre-operative assessment

## 2020-02-24 NOTE — Transfer of Care (Signed)
Immediate Anesthesia Transfer of Care Note  Patient: Carl Mcfarland  Procedure(s) Performed: Right ACL reconstruction using quadriceps tendon autograft, medial and lateral meniscus repair vs partial meniscectomy and possible chondroplasty - Dedra Skeens to Assist (Right )  Patient Location: PACU  Anesthesia Type:General  Level of Consciousness: awake  Airway & Oxygen Therapy: Patient Spontanous Breathing and Patient connected to face mask oxygen  Post-op Assessment: Report given to RN and Post -op Vital signs reviewed and stable  Post vital signs: Reviewed and stable  Last Vitals:  Vitals Value Taken Time  BP 122/80 02/24/20 1600  Temp    Pulse 84 02/24/20 1601  Resp 14 02/24/20 1601  SpO2 99 % 02/24/20 1601  Vitals shown include unvalidated device data.  Last Pain:  Vitals:   02/24/20 1209  TempSrc: Oral  PainSc: 3          Complications: No complications documented.

## 2020-02-27 ENCOUNTER — Encounter: Payer: Self-pay | Admitting: Orthopedic Surgery

## 2020-03-19 ENCOUNTER — Ambulatory Visit: Payer: No Typology Code available for payment source | Attending: Internal Medicine

## 2020-03-19 DIAGNOSIS — Z23 Encounter for immunization: Secondary | ICD-10-CM

## 2020-03-19 NOTE — Progress Notes (Signed)
   Covid-19 Vaccination Clinic  Name:  Carl Mcfarland    MRN: [id number] DOB: Nov 18, 1970  03/19/2020  [name] was observed post Covid-19 immunization for 15 minutes without incident. He was provided with Vaccine Information Sheet and instruction to access the V-Safe system.   [name] was instructed to call 911 with any severe reactions post vaccine: [name] Kitchen Difficulty breathing  . Swelling of face and throat  . A fast heartbeat  . A bad rash all over body  . Dizziness and weakness   Immunizations Administered    Name Date Dose VIS Date Route   Pfizer COVID-19 Vaccine 03/19/2020  1:58 PM 0.3 mL 12/28/2019 Intramuscular   Manufacturer: [employer], [employer]   Lot: [id number]   NDC: [address]-7

## 2020-03-23 ENCOUNTER — Telehealth: Payer: No Typology Code available for payment source | Admitting: Internal Medicine

## 2020-07-19 ENCOUNTER — Encounter: Payer: No Typology Code available for payment source | Admitting: Internal Medicine

## 2020-07-19 ENCOUNTER — Encounter: Payer: No Typology Code available for payment source | Admitting: Family Medicine

## 2020-08-27 ENCOUNTER — Encounter: Payer: No Typology Code available for payment source | Admitting: Family Medicine

## 2020-08-27 ENCOUNTER — Other Ambulatory Visit: Payer: Self-pay

## 2020-08-27 ENCOUNTER — Encounter: Payer: Self-pay | Admitting: Family Medicine

## 2020-08-27 ENCOUNTER — Ambulatory Visit (INDEPENDENT_AMBULATORY_CARE_PROVIDER_SITE_OTHER): Payer: No Typology Code available for payment source | Admitting: Family Medicine

## 2020-08-27 VITALS — BP 118/72 | HR 61 | Temp 98.1°F | Resp 18 | Ht 63.0 in | Wt 167.4 lb

## 2020-08-27 DIAGNOSIS — Z0000 Encounter for general adult medical examination without abnormal findings: Secondary | ICD-10-CM

## 2020-08-27 DIAGNOSIS — E782 Mixed hyperlipidemia: Secondary | ICD-10-CM | POA: Diagnosis not present

## 2020-08-27 DIAGNOSIS — K219 Gastro-esophageal reflux disease without esophagitis: Secondary | ICD-10-CM | POA: Diagnosis not present

## 2020-08-27 DIAGNOSIS — Z1211 Encounter for screening for malignant neoplasm of colon: Secondary | ICD-10-CM

## 2020-08-27 DIAGNOSIS — Z125 Encounter for screening for malignant neoplasm of prostate: Secondary | ICD-10-CM

## 2020-08-27 DIAGNOSIS — Z114 Encounter for screening for human immunodeficiency virus [HIV]: Secondary | ICD-10-CM

## 2020-08-27 NOTE — Progress Notes (Signed)
Patient: Carl Mcfarland, Male    DOB: 05/19/1970, 50 y.o.   MRN: [id number] [facility], [place] Visit Date: 08/27/2020  Today's Provider: Danelle Berry, PA-C   Chief Complaint  Patient presents with   Annual Exam   Subjective:   Annual physical exam:  Carl Mcfarland is a 50 y.o. male who presents today for health maintenance and annual & complete physical exam.   Exercise/Activity:  not exercising  Diet/nutrition: avoids junk food, but no other specific diet  Sleep:  hx of OSA in chart - pt notes sleep is good, no longer having OSA sx  HLD not on meds Recent ACL repair has limited exercise in the past 6+ months   USPSTF grade A and B recommendations - reviewed and addressed today  Depression:  Phq 9 completed today by patient, was reviewed by me with patient in the room, score is  negative, pt feels good PHQ 2/9 Scores 08/27/2020 07/13/2019 08/24/2017 03/23/2017  PHQ - 2 Score 0 0 0 0   Depression screen [facility] 2/9 08/27/2020 07/13/2019 08/24/2017 03/23/2017 08/20/2016  Decreased Interest 0 0 0 0 0  Down, Depressed, Hopeless 0 0 0 0 0  PHQ - 2 Score 0 0 0 0 0    Hep C Screening: done STD testing and prevention (HIV/chl/gon/syphilis): due - in monogomous relationship, no past risk factors Intimate partner violence:denies  Prostate cancer:  Prostate cancer screening with PSA: Discussed risks and benefits of PSA testing and provided handout. Pt wishes to have PSA drawn today.  Lab Results  Component Value Date   PSA 0.5 07/13/2019     Urinary Symptoms:  IPSS Questionnaire (AUA-7):  IPSS     Row Name 08/27/20 1005         International Prostate Symptom Score   How often have you had the sensation of not emptying your bladder? Not at All     How often have you had to urinate less than every two hours? Not at All     How often have you found you stopped and started again several times when you urinated? Not at All     How often have you found it  difficult to postpone urination? Not at All     How often have you had a weak urinary stream? Less than 1 in 5 times     How often have you had to strain to start urination? Not at All     How many times did you typically get up at night to urinate? None     Total IPSS Score 1           Quality of Life due to urinary symptoms     If you were to spend the rest of your life with your urinary condition just the way it is now how would you feel about that? Pleased             Advanced Care Planning:  A voluntary discussion about advance care planning including the explanation and discussion of advance directives.  Discussed health care proxy and Living will, and the patient was able to identify a health care proxy as his wife.  Patient does not have a living will at present time. If patient does have living will, I have requested they bring this to the clinic to be scanned in to their chart.  Health Maintenance  Topic Date Due   COLONOSCOPY (Pts 45-5yrs Insurance coverage will need to be confirmed)  Never done   COVID-19 Vaccine (4 - Booster for Pfizer series) 09/06/2020 (Originally 07/17/2020)   Zoster Vaccines- Shingrix (1 of 2) 11/27/2020 (Originally 08/04/2019)   TETANUS/TDAP  08/27/2021 (Originally 03/10/2020)   INFLUENZA VACCINE  10/08/2020   Hepatitis C Screening  Completed   HIV Screening  Completed   Pneumococcal Vaccine 60-51 Years old  Aged Out   HPV VACCINES  Aged Out    Skin cancer:  last skin survey was over a year ago.  Pt reports no hx of skin cancer, suspicious lesions/biopsies in the past. Skin tags around neck bothersome - some with trauma and pain  Colorectal cancer:  colonoscopy is due   Pt denies melena, hematochezia, change in bowels, unintentional weight loss or abd pain  Lung cancer:   Low Dose CT Chest recommended if Age 17-80 years, 20 pack-year currently smoking OR have quit w/in 15years. Patient does not qualify.   Social History   Tobacco Use   Smoking  status: Never   Smokeless tobacco: Never  Substance Use Topics   Alcohol use: No     Alcohol screening: Flowsheet Row Office Visit from 08/27/2020 in [facility]  AUDIT-C Score 0       AAA: n/a for age The USPSTF recommends one-time screening with ultrasonography in men ages 89 to 19 years who have ever smoked  ZOX:WRUE indicated today  Blood pressure/Hypertension: BP Readings from Last 3 Encounters:  08/27/20 118/72  02/24/20 109/68  07/13/19 110/78   Weight/Obesity: Wt Readings from Last 3 Encounters:  08/27/20 167 lb 6.4 oz (75.9 kg)  02/24/20 165 lb (74.8 kg)  07/13/19 168 lb 14.4 oz (76.6 kg)   BMI Readings from Last 3 Encounters:  08/27/20 29.65 kg/m  02/24/20 27.46 kg/m  07/13/19 29.92 kg/m    Lipids:  Lab Results  Component Value Date   CHOL 253 (H) 07/13/2019   CHOL 199 08/24/2017   CHOL 189 08/20/2016   Lab Results  Component Value Date   HDL 57 07/13/2019   HDL 52 08/24/2017   HDL 42 08/20/2016   Lab Results  Component Value Date   LDLCALC 168 (H) 07/13/2019   LDLCALC 126 (H) 08/24/2017   LDLCALC 124 (H) 08/20/2016   Lab Results  Component Value Date   TRIG 141 07/13/2019   TRIG 104 08/24/2017   TRIG 114 08/20/2016   Lab Results  Component Value Date   CHOLHDL 4.4 07/13/2019   CHOLHDL 3.8 08/24/2017   CHOLHDL 4.5 08/20/2016   No results found for: LDLDIRECT Based on the results of lipid panel his/her cardiovascular risk factor ( using [place] Cohort )  in the next 10 years is : The 10-year ASCVD risk score Denman George DC Montez Hageman., et al., 2013) is: 4%   Values used to calculate the score:     Age: 16 years     Sex: Male     Is Non-Hispanic African American: No     Diabetic: No     Tobacco smoker: No     Systolic Blood Pressure: 118 mmHg     Is BP treated: No     HDL Cholesterol: 57 mg/dL     Total Cholesterol: 253 mg/dL Glucose:  Glucose  Date Value Ref Range Status  03/20/2011 136 (H) 65 - 99 mg/dL Final    Glucose, Bld  Date Value Ref Range Status  07/13/2019 93 65 - 99 mg/dL Final    Comment:    .  Fasting reference interval .   04/06/2019 115 (H) 70 - 99 mg/dL Final  81/19/1478 80 65 - 139 mg/dL Final    Comment:    .        Non-fasting reference interval .     Social History      He  reports that he has never smoked. He has never used smokeless tobacco. He reports that he does not drink alcohol and does not use drugs.       Social History   Socioeconomic History   Marital status: Married    Spouse name: Not on file   Number of children: Not on file   Years of education: Not on file   Highest education level: Not on file  Occupational History   Not on file  Tobacco Use   Smoking status: Never   Smokeless tobacco: Never  Vaping Use   Vaping Use: Never used  Substance and Sexual Activity   Alcohol use: No   Drug use: No    Comment: past use   Sexual activity: Not Currently  Other Topics Concern   Not on file  Social History Narrative   Not on file   Social Determinants of Health   Financial Resource Strain: Low Risk    Difficulty of Paying Living Expenses: Not hard at all  Food Insecurity: No Food Insecurity   Worried About Programme researcher, broadcasting/film/video in the Last Year: Never true   Ran Out of Food in the Last Year: Never true  Transportation Needs: No Transportation Needs   Lack of Transportation (Medical): No   Lack of Transportation (Non-Medical): No  Physical Activity: Inactive   Days of Exercise per Week: 0 days   Minutes of Exercise per Session: 0 min  Stress: No Stress Concern Present   Feeling of Stress : Not at all  Social Connections: Socially Integrated   Frequency of Communication with Friends and Family: More than three times a week   Frequency of Social Gatherings with Friends and Family: More than three times a week   Attends Religious Services: 1 to 4 times per year   Active Member of [employer] or Organizations: No   Attends Museum/gallery exhibitions officer: 1 to 4 times per year   Marital Status: Married     Family History        Family Status  Relation Name Status   Mother  Alive   Father  Alive   Sister 1 Alive   Brother  Alive   Daughter  Alive   Son 5 Alive   MGM  Deceased       many issues   MGF  Deceased       heart attack   PGM  Alive   PGF  Deceased       dementia   Brother  Alive   Daughter  Alive        His family history includes Dementia in his paternal grandfather; Diabetes in his mother; Heart attack in his maternal grandfather.       Family History  Problem Relation Age of Onset   Diabetes Mother    Heart attack Maternal Grandfather    Dementia Paternal Grandfather     Patient Active Problem List   Diagnosis Date Noted   Mixed hyperlipidemia 07/14/2019   Overweight (BMI 25.0-29.9) 07/13/2019   Colon cancer screening 08/20/2016   Preventative health care 05/05/2015   Nail abnormality 04/26/2015   Tinea pedis 04/26/2015   Sleep  apnea    GERD (gastroesophageal reflux disease)    Calculus of kidney    Vitamin D deficiency disease    Vitamin B12 deficiency    Low testosterone     Past Surgical History:  Procedure Laterality Date   GANGLION CYST EXCISION Right 03/20/2016   Procedure: REMOVAL GANGLION OF WRIST;  Surgeon: Kennedy BuckerMichael Menz, MD;  Location: [facility] ORS;  Service: Orthopedics;  Laterality: Right;   HERNIA REPAIR     umbilical   KNEE ARTHROSCOPY WITH ANTERIOR CRUCIATE LIGAMENT (ACL) REPAIR WITH HAMSTRING GRAFT Right 02/24/2020   Procedure: Right ACL reconstruction using quadriceps tendon autograft, medial and lateral meniscus repair vs partial meniscectomy and possible chondroplasty - Dedra Skeensodd Mundy to Assist;  Surgeon: Signa KellPatel, Sunny, MD;  Location: [facility] ORS;  Service: Orthopedics;  Laterality: Right;  Dedra Skeensodd Mundy to Assist. AM start time.   KNEE SURGERY Right Dec. 2010     Current Outpatient Medications:    acetaminophen (TYLENOL) 500 MG tablet, Take 1,000 mg by mouth every 8  (eight) hours as needed for moderate pain., Disp: , Rfl:    ascorbic acid (VITAMIN C) 500 MG tablet, Take 500 mg by mouth daily., Disp: , Rfl:    cholecalciferol (VITAMIN D3) 25 MCG (1000 UNIT) tablet, Take 1,000 Units by mouth daily., Disp: , Rfl:    omeprazole (PRILOSEC OTC) 20 MG tablet, Take 20 mg by mouth daily as needed., Disp: , Rfl:   No Known Allergies  [facility] [place] [facility] - General (Family Medicine) Lada, Janit BernMelinda P, MD (Family Medicine)   Chart Review: I personally reviewed active problem list, medication list, allergies, family history, social history, health maintenance, notes from last encounter, lab results, imaging with the patient/caregiver today.   Review of Systems  Constitutional: Negative.  Negative for activity change, appetite change, fatigue and unexpected weight change.  HENT: Negative.    Eyes: Negative.   Respiratory: Negative.  Negative for shortness of breath.   Cardiovascular: Negative.  Negative for chest pain, palpitations and leg swelling.  Gastrointestinal: Negative.  Negative for abdominal pain and blood in stool.  Endocrine: Negative.   Genitourinary: Negative.  Negative for decreased urine volume, difficulty urinating, testicular pain and urgency.  Skin: Negative.  Negative for color change and pallor.  Allergic/Immunologic: Negative.   Neurological: Negative.  Negative for syncope, weakness, light-headedness and numbness.  Psychiatric/Behavioral: Negative.  Negative for confusion, dysphoric mood, self-injury and suicidal ideas. The patient is not nervous/anxious.   All other systems reviewed and are negative.        Objective:   Vitals:  Vitals:   08/27/20 0947  BP: 118/72  Pulse: 61  Resp: 18  Temp: 98.1 F (36.7 C)  TempSrc: Oral  SpO2: 99%  Weight: 167 lb 6.4 oz (75.9 kg)  Height: 5\' 3"  (1.6 m)    Body mass index is 29.65 kg/m.  Physical Exam Vitals and nursing note reviewed.   Constitutional:      General: He is not in acute distress.    Appearance: Normal appearance. He is well-developed. He is not ill-appearing, toxic-appearing or diaphoretic.     Interventions: Face mask in place.  HENT:     Head: Normocephalic and atraumatic.     Jaw: No trismus.     Right Ear: Tympanic membrane, ear canal and external ear normal.     Left Ear: Tympanic membrane, ear canal and external ear normal.     Nose: Nose normal. No mucosal edema or rhinorrhea.  Right Sinus: No maxillary sinus tenderness or frontal sinus tenderness.     Left Sinus: No maxillary sinus tenderness or frontal sinus tenderness.     Mouth/Throat:     Mouth: Mucous membranes are moist.     Pharynx: Oropharynx is clear. Uvula midline. No oropharyngeal exudate, posterior oropharyngeal erythema or uvula swelling.  Eyes:     General: Lids are normal. No scleral icterus.       Right eye: No discharge.        Left eye: No discharge.     Conjunctiva/sclera: Conjunctivae normal.     Pupils: Pupils are equal, round, and reactive to light.  Neck:     Thyroid: No thyroid mass, thyromegaly or thyroid tenderness.     Trachea: Trachea and phonation normal. No tracheal deviation.  Cardiovascular:     Rate and Rhythm: Normal rate and regular rhythm.     Pulses: Normal pulses.          Radial pulses are 2+ on the right side and 2+ on the left side.       Posterior tibial pulses are 2+ on the right side and 2+ on the left side.     Heart sounds: Normal heart sounds. No murmur heard.   No friction rub. No gallop.  Pulmonary:     Effort: Pulmonary effort is normal. No respiratory distress.     Breath sounds: Normal breath sounds. No stridor. No wheezing, rhonchi or rales.  Abdominal:     General: Bowel sounds are normal. There is no distension.     Palpations: Abdomen is soft.     Tenderness: There is no abdominal tenderness. There is no guarding or rebound.  Musculoskeletal:        General: Normal range of  motion.     Cervical back: Full passive range of motion without pain, normal range of motion and neck supple.     Right lower leg: No edema.     Left lower leg: No edema.  Lymphadenopathy:     Cervical: No cervical adenopathy.  Skin:    General: Skin is warm and dry.     Capillary Refill: Capillary refill takes less than 2 seconds.     Coloration: Skin is not jaundiced or pale.     Findings: Lesion (scattered multiple skin tags around neck/collar area) present. No rash.     Nails: There is no clubbing.  Neurological:     Mental Status: He is alert and oriented to person, place, and time. Mental status is at baseline.     Cranial Nerves: No dysarthria or facial asymmetry.     Motor: No tremor or abnormal muscle tone.     Gait: Gait normal.  Psychiatric:        Mood and Affect: Mood normal.        Speech: Speech normal.        Behavior: Behavior normal. Behavior is cooperative.     No results found for this or any previous visit (from the past 2160 hour(s)).  Diabetic Foot Exam: Diabetic Foot Exam - Simple   No data filed     Fall Risk: Fall Risk  08/27/2020 07/13/2019 08/24/2017 03/23/2017 08/20/2016  Falls in the past year? 0 0 No No No  Number falls in past yr: 0 0 - - -  Injury with Fall? 0 0 - - -  Follow up Falls evaluation completed - - - -    Functional Status Survey: Is the patient deaf or  have difficulty hearing?: No Does the patient have difficulty seeing, even when wearing glasses/contacts?: No Does the patient have difficulty concentrating, remembering, or making decisions?: Yes Does the patient have difficulty walking or climbing stairs?: No Does the patient have difficulty dressing or bathing?: No Does the patient have difficulty doing errands alone such as visiting a doctor's office or shopping?: No   Assessment & Plan:    CPE completed today  Prostate cancer screening and PSA options (with potential risks and benefits of testing vs not testing) were  discussed along with recent recs/guidelines, shared decision making and handout/information given to pt today  USPSTF grade A and B recommendations reviewed with patient; age-appropriate recommendations, preventive care, screening tests, etc discussed and encouraged; healthy living encouraged; see AVS for patient education given to patient  Discussed importance of 150 minutes of physical activity weekly, AHA exercise recommendations given to pt in AVS/handout  Discussed importance of healthy diet:  eating lean meats and proteins, avoiding trans fats and saturated fats, avoid simple sugars and excessive carbs in diet, eat 6 servings of fruit/vegetables daily and drink plenty of water and avoid sweet beverages.  DASH diet reviewed if pt has HTN  Recommended pt to do annual eye exam and routine dental exams/cleanings  Reviewed Health Maintenance: Health Maintenance  Topic Date Due   COLONOSCOPY (Pts 45-63yrs Insurance coverage will need to be confirmed)  Never done   COVID-19 Vaccine (4 - Booster for Pfizer series) 09/06/2020 (Originally 07/17/2020)   Zoster Vaccines- Shingrix (1 of 2) 11/27/2020 (Originally 08/04/2019)   TETANUS/TDAP  08/27/2021 (Originally 03/10/2020)   INFLUENZA VACCINE  10/08/2020   Hepatitis C Screening  Completed   HIV Screening  Completed   Pneumococcal Vaccine 58-66 Years old  Aged Out   HPV VACCINES  Aged Out    Immunizations: Immunization History  Administered Date(s) Administered   Influenza,inj,Quad PF,6+ Mos 04/26/2015   Influenza-Unspecified 12/30/2013   PFIZER(Purple Top)SARS-COV-2 Vaccination 06/13/2019, 07/06/2019, 03/19/2020   Tdap 03/10/2010      ICD-10-CM   1. Adult general medical exam  Z00.00 CBC with Differential/Platelet    COMPLETE METABOLIC PANEL WITH GFR    Lipid panel    HIV Antibody (routine testing w rflx)    PSA    2. Encounter for screening colonoscopy  Z12.11 Cologuard    3. Mixed hyperlipidemia  E78.2 COMPLETE METABOLIC PANEL WITH  GFR    Lipid panel   last labs lipids very high, discussed diet/lifestyle efforts to reduce cholesterol and indications for initiating statins, recheck labs, 6 month f/up    4. Gastroesophageal reflux disease without esophagitis  K21.9    managed with PPI and OTC meds PRN    5. Screening for prostate cancer  Z12.5 PSA    6. Screening for HIV without presence of risk factors  Z11.4 HIV Antibody (routine testing w rflx)       Danelle Berry, PA-C 08/27/20 10:29 AM  [facility]

## 2020-08-27 NOTE — Patient Instructions (Signed)
Preventive Care 40-51 Years Old, Male Preventive care refers to lifestyle choices and visits with your health care provider that can promote health and wellness. This includes: A yearly physical exam. This is also called an annual wellness visit. Regular dental and eye exams. Immunizations. Screening for certain conditions. Healthy lifestyle choices, such as: Eating a healthy diet. Getting regular exercise. Not using drugs or products that contain nicotine and tobacco. Limiting alcohol use. What can I expect for my preventive care visit? Physical exam Your health care provider will check your: Height and weight. These may be used to calculate your BMI (body mass index). BMI is a measurement that tells if you are at a healthy weight. Heart rate and blood pressure. Body temperature. Skin for abnormal spots. Counseling Your health care provider may ask you questions about your: Past medical problems. Family's medical history. Alcohol, tobacco, and drug use. Emotional well-being. Home life and relationship well-being. Sexual activity. Diet, exercise, and sleep habits. Work and work environment. Access to firearms. What immunizations do I need?  Vaccines are usually given at various ages, according to a schedule. Your health care provider will recommend vaccines for you based on your age, medicalhistory, and lifestyle or other factors, such as travel or where you work. What tests do I need? Blood tests Lipid and cholesterol levels. These may be checked every 5 years, or more often if you are over 50 years old. Hepatitis C test. Hepatitis B test. Screening Lung cancer screening. You may have this screening every year starting at age 55 if you have a 30-pack-year history of smoking and currently smoke or have quit within the past 15 years. Prostate cancer screening. Recommendations will vary depending on your family history and other risks. Genital exam to check for testicular cancer  or hernias. Colorectal cancer screening. All adults should have this screening starting at age 50 and continuing until age 75. Your health care provider may recommend screening at age 45 if you are at increased risk. You will have tests every 1-10 years, depending on your results and the type of screening test. Diabetes screening. This is done by checking your blood sugar (glucose) after you have not eaten for a while (fasting). You may have this done every 1-3 years. STD (sexually transmitted disease) testing, if you are at risk. Follow these instructions at home: Eating and drinking  Eat a diet that includes fresh fruits and vegetables, whole grains, lean protein, and low-fat dairy products. Take vitamin and mineral supplements as recommended by your health care provider. Do not drink alcohol if your health care provider tells you not to drink. If you drink alcohol: Limit how much you have to 0-2 drinks a day. Be aware of how much alcohol is in your drink. In the [place] one drink equals one 12 oz bottle of beer (355 mL), one 5 oz glass of wine (148 mL), or one 1 oz glass of hard liquor (44 mL).  Lifestyle Take daily care of your teeth and gums. Brush your teeth every morning and night with fluoride toothpaste. Floss one time each day. Stay active. Exercise for at least 30 minutes 5 or more days each week. Do not use any products that contain nicotine or tobacco, such as cigarettes, e-cigarettes, and chewing tobacco. If you need help quitting, ask your health care provider. Do not use drugs. If you are sexually active, practice safe sex. Use a condom or other form of protection to prevent STIs (sexually transmitted infections). If told by   your health care provider, take low-dose aspirin daily starting at age 55. Find healthy ways to cope with stress, such as: Meditation, yoga, or listening to music. Journaling. Talking to a trusted person. Spending time with friends and  family. Safety Always wear your seat belt while driving or riding in a vehicle. Do not drive: If you have been drinking alcohol. Do not ride with someone who has been drinking. When you are tired or distracted. While texting. Wear a helmet and other protective equipment during sports activities. If you have firearms in your house, make sure you follow all gun safety procedures. What's next? Go to your health care provider once a year for an annual wellness visit. Ask your health care provider how often you should have your eyes and teeth checked. Stay up to date on all vaccines. This information is not intended to replace advice given to you by your health care provider. Make sure you discuss any questions you have with your healthcare provider. Document Revised: 11/23/2018 Document Reviewed: 02/18/2018 Elsevier Patient Education  2022 Else[employer] Inc.   Preventing High Cholesterol Cholesterol is a white, waxy substance similar to fat that the human body needs to help build cells. The liver makes all the cholesterol that a person's body needs. Having high cholesterol (hypercholesterolemia) increases your risk for heart disease and stroke. Extra or excess cholesterolcomes from the food that you eat. High cholesterol can often be prevented with diet and lifestyle changes. If you already have high cholesterol, you can control it with diet, lifestyle changes,and medicines. How can high cholesterol affect me? If you have high cholesterol, fatty deposits (plaques) may build up on the walls of your blood vessels. The blood vessels that carry blood away from your heart are called arteries. Plaques make the arteries narrower and stiffer. This in turn can: Restrict or block blood flow and cause blood clots to form. Increase your risk for heart attack and stroke. What can increase my risk for high cholesterol? This condition is more likely to develop in people who: Eat foods that are high in saturated  fat or cholesterol. Saturated fat is mostly found in foods that come from animal sources. Are overweight. Are not getting enough exercise. Have a family history of high cholesterol (familial hypercholesterolemia). What actions can I take to prevent this? Nutrition  Eat less saturated fat. Avoid trans fats (partially hydrogenated oils). These are often found in margarine and in some baked goods, fried foods, and snacks bought in packages. Avoid precooked or cured meat, such as bacon, sausages, or meat loaves. Avoid foods and drinks that have added sugars. Eat more fruits, vegetables, and whole grains. Choose healthy sources of protein, such as fish, poultry, lean cuts of red meat, beans, peas, lentils, and nuts. Choose healthy sources of fat, such as: Nuts. Vegetable oils, especially olive oil. Fish that have healthy fats, such as omega-3 fatty acids. These fish include mackerel or salmon.  Lifestyle Lose weight if you are overweight. Maintaining a healthy body mass index (BMI) can help prevent or control high cholesterol. It can also lower your risk for diabetes and high blood pressure. Ask your health care provider to help you with a diet and exercise plan to lose weight safely. Do not use any products that contain nicotine or tobacco, such as cigarettes, e-cigarettes, and chewing tobacco. If you need help quitting, ask your health care provider. Alcohol use Do not drink alcohol if: Your health care provider tells you not to drink. You are pregnant,  may be pregnant, or are planning to become pregnant. If you drink alcohol: Limit how much you use to: 0-1 drink a day for women. 0-2 drinks a day for men. Be aware of how much alcohol is in your drink. In the [place] one drink equals one 12 oz bottle of beer (355 mL), one 5 oz glass of wine (148 mL), or one 1 oz glass of hard liquor (44 mL). Activity  Get enough exercise. Do exercises as told by your health care provider. Each week, do at  least 150 minutes of exercise that takes a medium level of effort (moderate-intensity exercise). This kind of exercise: Makes your heart beat faster while allowing you to still be able to talk. Can be done in short sessions several times a day or longer sessions a few times a week. For example, on 5 days each week, you could walk fast or ride your bike 3 times a day for 10 minutes each time.  Medicines Your health care provider may recommend medicines to help lower cholesterol. This may be a medicine to lower the amount of cholesterol that your liver makes. You may need medicine if: Diet and lifestyle changes have not lowered your cholesterol enough. You have high cholesterol and other risk factors for heart disease or stroke. Take over-the-counter and prescription medicines only as told by your health care provider. General information Manage your risk factors for high cholesterol. Talk with your health care provider about all your risk factors and how to lower your risk. Manage other conditions that you have, such as diabetes or high blood pressure (hypertension). Have blood tests to check your cholesterol levels at regular points in time as told by your health care provider. Keep all follow-up visits as told by your health care provider. This is important. Where to find more information American Heart Association: www.heart.org National Heart, Lung, and Blood Institute: [employer].is Summary High cholesterol increases your risk for heart disease and stroke. By keeping your cholesterol level low, you can reduce your risk for these conditions. High cholesterol can often be prevented with diet and lifestyle changes. Work with your health care provider to manage your risk factors, and have your blood tested regularly. This information is not intended to replace advice given to you by your health care provider. Make sure you discuss any questions you have with your healthcare  provider. Document Revised: 12/07/2018 Document Reviewed: 12/07/2018 Elsevier Patient Education  2022 [employer].

## 2020-08-28 LAB — CBC WITH DIFFERENTIAL/PLATELET
Absolute Monocytes: 490 cells/uL (ref 200–950)
Basophils Absolute: 28 cells/uL (ref 0–200)
Basophils Relative: 0.5 %
Eosinophils Absolute: 88 cells/uL (ref 15–500)
Eosinophils Relative: 1.6 %
HCT: 46.2 % (ref 38.5–50.0)
Hemoglobin: 15.9 g/dL (ref 13.2–17.1)
Lymphs Abs: 2002 cells/uL (ref 850–3900)
MCH: 31.5 pg (ref 27.0–33.0)
MCHC: 34.4 g/dL (ref 32.0–36.0)
MCV: 91.7 fL (ref 80.0–100.0)
MPV: 10.6 fL (ref 7.5–12.5)
Monocytes Relative: 8.9 %
Neutro Abs: 2893 cells/uL (ref 1500–7800)
Neutrophils Relative %: 52.6 %
Platelets: 239 10*3/uL (ref 140–400)
RBC: 5.04 10*6/uL (ref 4.20–5.80)
RDW: 12.9 % (ref 11.0–15.0)
Total Lymphocyte: 36.4 %
WBC: 5.5 10*3/uL (ref 3.8–10.8)

## 2020-08-28 LAB — COMPLETE METABOLIC PANEL WITH GFR
AG Ratio: 1.5 (calc) (ref 1.0–2.5)
ALT: 26 U/L (ref 9–46)
AST: 18 U/L (ref 10–35)
Albumin: 4.2 g/dL (ref 3.6–5.1)
Alkaline phosphatase (APISO): 82 U/L (ref 35–144)
BUN: 16 mg/dL (ref 7–25)
CO2: 30 mmol/L (ref 20–32)
Calcium: 9.1 mg/dL (ref 8.6–10.3)
Chloride: 105 mmol/L (ref 98–110)
Creat: 0.93 mg/dL (ref 0.70–1.33)
GFR, Est African American: 110 mL/min/{1.73_m2} (ref >=60)
GFR, Est Non African American: 95 mL/min/{1.73_m2} (ref >=60)
Globulin: 2.8 g/dL (calc) (ref 1.9–3.7)
Glucose, Bld: 82 mg/dL (ref 65–99)
Potassium: 4.4 mmol/L (ref 3.5–5.3)
Sodium: 139 mmol/L (ref 135–146)
Total Bilirubin: 0.3 mg/dL (ref 0.2–1.2)
Total Protein: 7 g/dL (ref 6.1–8.1)

## 2020-08-28 LAB — HIV ANTIBODY (ROUTINE TESTING W REFLEX): HIV 1&2 Ab, 4th Generation: NONREACTIVE

## 2020-08-28 LAB — PSA: PSA: 0.34 ng/mL (ref <=4.00)

## 2020-08-28 LAB — LIPID PANEL
Cholesterol: 179 mg/dL (ref <200)
HDL: 47 mg/dL (ref >=40)
LDL Cholesterol (Calc): 106 mg/dL (calc) — ABNORMAL HIGH
Non-HDL Cholesterol (Calc): 132 mg/dL (calc) — ABNORMAL HIGH (ref <130)
Total CHOL/HDL Ratio: 3.8 (calc) (ref <5.0)
Triglycerides: 144 mg/dL (ref <150)

## 2020-08-30 ENCOUNTER — Ambulatory Visit: Payer: Self-pay | Admitting: *Deleted

## 2020-08-30 NOTE — Telephone Encounter (Signed)
Pt given lab results per notes of L. Tapia, PA from 08/28/20 on 08/30/20. Patient requesting to review lab values again. Pt verbalized understanding.

## 2020-08-31 NOTE — Telephone Encounter (Signed)
Called patient to go over lab results again and stated "we already got them we don't need to go over".

## 2020-09-05 ENCOUNTER — Other Ambulatory Visit: Payer: No Typology Code available for payment source

## 2020-09-13 ENCOUNTER — Other Ambulatory Visit: Payer: Self-pay

## 2021-01-03 ENCOUNTER — Telehealth: Payer: Self-pay

## 2021-01-03 NOTE — Telephone Encounter (Signed)
Per Sheliah Mends- Pt needs an appointment to be referred for this matter. No where in her recent notes do I see anything about skin irritation. Thank You!

## 2021-01-03 NOTE — Telephone Encounter (Signed)
Copied from CRM [id number]. Topic: Referral - Request for Referral >> Jan 03, 2021  4:12 PM Gaetana Michaelis A wrote: Has patient seen PCP for this complaint? Yes. *If NO, is insurance requiring patient see PCP for this issue before PCP can refer them? Referral for which specialty: Dermatology   Preferred provider/office: Patient has no preference  Reason for referral: Patient has skin irritation around the neck

## 2021-01-21 ENCOUNTER — Ambulatory Visit: Payer: No Typology Code available for payment source | Admitting: Family Medicine

## 2021-02-13 ENCOUNTER — Other Ambulatory Visit: Payer: Self-pay | Admitting: Podiatry

## 2021-02-13 DIAGNOSIS — M722 Plantar fascial fibromatosis: Secondary | ICD-10-CM

## 2021-02-20 ENCOUNTER — Other Ambulatory Visit: Payer: Self-pay | Admitting: Podiatry

## 2021-02-20 DIAGNOSIS — M722 Plantar fascial fibromatosis: Secondary | ICD-10-CM

## 2021-02-21 ENCOUNTER — Ambulatory Visit: Payer: BC Managed Care – PPO

## 2021-04-15 ENCOUNTER — Encounter: Payer: Self-pay | Admitting: Internal Medicine

## 2021-04-15 ENCOUNTER — Ambulatory Visit: Payer: BC Managed Care – PPO | Admitting: Internal Medicine

## 2021-04-15 VITALS — BP 116/72 | HR 56 | Temp 98.3°F | Resp 16 | Ht 63.0 in | Wt 169.5 lb

## 2021-04-15 DIAGNOSIS — Z23 Encounter for immunization: Secondary | ICD-10-CM | POA: Diagnosis not present

## 2021-04-15 DIAGNOSIS — R1032 Left lower quadrant pain: Secondary | ICD-10-CM | POA: Diagnosis not present

## 2021-04-15 MED ORDER — AMOXICILLIN-POT CLAVULANATE 875-125 MG PO TABS: 1.0000 | ORAL_TABLET | Freq: Two times a day (BID) | ORAL | 0 refills | Status: AC

## 2021-04-15 MED ORDER — METRONIDAZOLE 500 MG PO TABS: 500.0000 mg | ORAL_TABLET | Freq: Three times a day (TID) | ORAL | 0 refills | Status: AC

## 2021-04-15 NOTE — Progress Notes (Signed)
Acute Office Visit  Subjective:    Patient ID: Carl Mcfarland, male    DOB: 14-Jul-1970, 51 y.o.   MRN: [id number]  Chief Complaint  Patient presents with   Abdominal Pain    LLQ onset 3 weeks no other symptoms    HPI Patient is in today for abdominal discomfort.  Abdominal Complaint: -Duration: 3 weeks -Frequency: intermittent -Nature: sharp -Location: LLQ  -Severity: 8/10  -Radiation: no -Alleviating factors: burping -Aggravating factors: standing up straight, laying down and bending over  -Treatments attempted: none, stopped taking Omeprazole but took one last night, didn't help  -Constipation: no -Diarrhea: no -Episodes of diarrhea/day: -Mucous in the stool: no -Heartburn: no -Bloating:no -Passing Gas: yes -Nausea: no -Vomiting: no -Episodes of vomit/day: -Melena or hematochezia: no -Fever: no -Weight loss: no -Change in Appetite: no   Past Medical History:  Diagnosis Date   GERD (gastroesophageal reflux disease)    OCC    History of kidney stones    Low testosterone    Vitamin B12 deficiency    Vitamin D deficiency disease     Past Surgical History:  Procedure Laterality Date   GANGLION CYST EXCISION Right 03/20/2016   Procedure: REMOVAL GANGLION OF WRIST;  Surgeon: Hessie Knows, MD;  Location: [facility];  Service: Orthopedics;  Laterality: Right;   HERNIA REPAIR     umbilical   KNEE ARTHROSCOPY WITH ANTERIOR CRUCIATE LIGAMENT (ACL) REPAIR WITH HAMSTRING GRAFT Right 02/24/2020   Procedure: Right ACL reconstruction using quadriceps tendon autograft, medial and lateral meniscus repair vs partial meniscectomy and possible chondroplasty - Reche Dixon to Assist;  Surgeon: Leim Fabry, MD;  Location: [facility];  Service: Orthopedics;  Laterality: Right;  Reche Dixon to Assist. AM start time.   KNEE SURGERY Right Dec. 2010    Family History  Problem Relation Age of Onset   Diabetes Mother    Heart attack Maternal Grandfather    Dementia Paternal  Grandfather     Social History   Socioeconomic History   Marital status: Married    Spouse name: Not on file   Number of children: Not on file   Years of education: Not on file   Highest education level: Not on file  Occupational History   Not on file  Tobacco Use   Smoking status: Never   Smokeless tobacco: Never  Vaping Use   Vaping Use: Never used  Substance and Sexual Activity   Alcohol use: No   Drug use: No    Comment: past use   Sexual activity: Not Currently  Other Topics Concern   Not on file  Social History Narrative   Not on file   Social Determinants of Health   Financial Resource Strain: Low Risk    Difficulty of Paying Living Expenses: Not hard at all  Food Insecurity: No Food Insecurity   Worried About Charity fundraiser in the Last Year: Never true   [place] in the Last Year: Never true  Transportation Needs: No Transportation Needs   Lack of Transportation (Medical): No   Lack of Transportation (Non-Medical): No  Physical Activity: Inactive   Days of Exercise per Week: 0 days   Minutes of Exercise per Session: 0 min  Stress: No Stress Concern Present   Feeling of Stress : Not at all  Social Connections: Socially Integrated   Frequency of Communication with Friends and Family: More than three times a week   Frequency of Social Gatherings with Friends and Family:  More than three times a week   Attends Religious Services: 1 to 4 times per year   Active Member of Clubs or Organizations: No   Attends Banker Meetings: 1 to 4 times per year   Marital Status: Married  Catering manager Violence: Not At Risk   Fear of Current or Ex-Partner: No   Emotionally Abused: No   Physically Abused: No   Sexually Abused: No    Outpatient Medications Prior to Visit  Medication Sig Dispense Refill   acetaminophen (TYLENOL) 500 MG tablet Take 1,000 mg by mouth every 8 (eight) hours as needed for moderate pain.     ascorbic acid (VITAMIN C)  500 MG tablet Take 500 mg by mouth daily.     cholecalciferol (VITAMIN D3) 25 MCG (1000 UNIT) tablet Take 1,000 Units by mouth daily.     omeprazole (PRILOSEC OTC) 20 MG tablet Take 20 mg by mouth daily as needed.     No facility-administered medications prior to visit.    No Known Allergies  Review of Systems  Constitutional:  Negative for chills, fever and unexpected weight change.  Gastrointestinal:  Positive for abdominal pain. Negative for blood in stool, constipation, nausea and vomiting.  Genitourinary:  Negative for dysuria and hematuria.  Skin: Negative.       Objective:    Physical Exam Constitutional:      Appearance: He is well-developed.  HENT:     Head: Normocephalic and atraumatic.  Cardiovascular:     Rate and Rhythm: Normal rate and regular rhythm.  Pulmonary:     Effort: Pulmonary effort is normal.     Breath sounds: Normal breath sounds.  Abdominal:     General: Abdomen is flat. Bowel sounds are normal. There is no distension.     Palpations: Abdomen is soft.     Tenderness: There is abdominal tenderness in the left lower quadrant. There is no guarding or rebound.  Skin:    General: Skin is warm and dry.  Neurological:     General: No focal deficit present.     Mental Status: He is alert. He is disoriented.  Psychiatric:        Mood and Affect: Mood normal.        Behavior: Behavior normal.    BP 116/72    Pulse (!) 56    Temp 98.3 F (36.8 C)    Resp 16    Ht 5\' 3"  (1.6 m)    Wt 169 lb 8 oz (76.9 kg)    SpO2 98%    BMI 30.03 kg/m  Wt Readings from Last 3 Encounters:  04/15/21 169 lb 8 oz (76.9 kg)  08/27/20 167 lb 6.4 oz (75.9 kg)  02/24/20 165 lb (74.8 kg)    Health Maintenance Due  Topic Date Due   COLONOSCOPY (Pts 45-27yrs Insurance coverage will need to be confirmed)  Never done   Zoster Vaccines- Shingrix (1 of 2) Never done   COVID-19 Vaccine (4 - Booster for Pfizer series) 05/14/2020   INFLUENZA VACCINE  10/08/2020    There are no  preventive care reminders to display for this patient.   Lab Results  Component Value Date   TSH 1.37 07/13/2019   Lab Results  Component Value Date   WBC 5.5 08/27/2020   HGB 15.9 08/27/2020   HCT 46.2 08/27/2020   MCV 91.7 08/27/2020   PLT 239 08/27/2020   Lab Results  Component Value Date   NA 139 08/27/2020  K 4.4 08/27/2020   CO2 30 08/27/2020   GLUCOSE 82 08/27/2020   BUN 16 08/27/2020   CREATININE 0.93 08/27/2020   BILITOT 0.3 08/27/2020   ALKPHOS 67 08/20/2016   AST 18 08/27/2020   ALT 26 08/27/2020   PROT 7.0 08/27/2020   ALBUMIN 4.1 08/20/2016   CALCIUM 9.1 08/27/2020   ANIONGAP 8 04/06/2019   Lab Results  Component Value Date   CHOL 179 08/27/2020   Lab Results  Component Value Date   HDL 47 08/27/2020   Lab Results  Component Value Date   LDLCALC 106 (H) 08/27/2020   Lab Results  Component Value Date   TRIG 144 08/27/2020   Lab Results  Component Value Date   CHOLHDL 3.8 08/27/2020   No results found for: HGBA1C     Assessment & Plan:   1. Need for influenza vaccination: Flu vaccine administered today.  - Flu Vaccine QUAD 6+ mos PF IM (Fluarix Quad PF)  2. Abdominal pain, LLQ (left lower quadrant): Symptoms consistent with uncomplicated diverticulitis, discussed bowel regimen and liquid diet for the next 1-3 days. As pain has been occurring for the last 3 weeks, we will treat with antibiotics and follow up in 2 weeks for recheck. If symptoms become severe he will present to the emergency room.   - amoxicillin-clavulanate (AUGMENTIN) 875-125 MG tablet; Take 1 tablet by mouth 2 (two) times daily for 5 days.  Dispense: 10 tablet; Refill: 0 - metroNIDAZOLE (FLAGYL) 500 MG tablet; Take 1 tablet (500 mg total) by mouth 3 (three) times daily for 5 days.  Dispense: 15 tablet; Refill: 0   Teodora Medici, DO

## 2021-04-15 NOTE — Patient Instructions (Addendum)
It was great seeing you today!  Plan discussed at today's visit: -Abdominal pain most consistent with diverticulitis, I will prescribe 2 antibiotics to take at the same time -Continue to stay well hydrated and try to eat very bland or even a liquid only diet for the next 1-3 days  -Please do Cologuard in about 2 weeks  Follow up in: 2 weeks   Take care and let us know if you have any questions or concerns prior to your next visit.  Dr. Rosana Berger  Diverticulosis Diverticulosis is a condition that develops when small pouches (diverticula) form in the wall of the large intestine (colon). The colon is where water is absorbed and stool (feces) is formed. The pouches form when the inside layer of the colon pushes through weak spots in the outer layers of the colon. You may have a few pouches or many of them. The pouches usually do not cause problems unless they become inflamed or infected. When this happens, the condition is called diverticulitis. What are the causes? The cause of this condition is not known. What increases the risk? The following factors may make you more likely to develop this condition: Being older than age 69. Your risk for this condition increases with age. Diverticulosis is rare among people younger than age 17. By age 20, many people have it. Eating a low-fiber diet. Having frequent constipation. Being overweight. Not getting enough exercise. Smoking. Taking over-the-counter pain medicines, like aspirin and ibuprofen. Having a family history of diverticulosis. What are the signs or symptoms? In most people, there are no symptoms of this condition. If you do have symptoms, they may include: Bloating. Cramps in the abdomen. Constipation or diarrhea. Pain in the lower left side of the abdomen. How is this diagnosed? Because diverticulosis usually has no symptoms, it is most often diagnosed during an exam for other colon problems. The condition may be diagnosed  by: Using a flexible scope to examine the colon (colonoscopy). Taking an X-ray of the colon after dye has been put into the colon (barium enema). Having a CT scan. How is this treated? You may not need treatment for this condition. Your health care provider may recommend treatment to prevent problems. You may need treatment if you have symptoms or if you previously had diverticulitis. Treatment may include: Eating a high-fiber diet. Taking a fiber supplement. Taking a live bacteria supplement (probiotic). Taking medicine to relax your colon. Follow these instructions at home: Medicines Take over-the-counter and prescription medicines only as told by your health care provider. If told by your health care provider, take a fiber supplement or probiotic. Constipation prevention Your condition may cause constipation. To prevent or treat constipation, you may need to: Drink enough fluid to keep your urine pale yellow. Take over-the-counter or prescription medicines. Eat foods that are high in fiber, such as beans, whole grains, and fresh fruits and vegetables. Limit foods that are high in fat and processed sugars, such as fried or sweet foods.   General instructions Try not to strain when you have a bowel movement. Keep all follow-up visits as told by your health care provider. This is important. Contact a health care provider if you: Have pain in your abdomen. Have bloating. Have cramps. Have not had a bowel movement in 3 days. Get help right away if: Your pain gets worse. Your bloating becomes very bad. You have a fever or chills, and your symptoms suddenly get worse. You vomit. You have bowel movements that are bloody or  black. You have bleeding from your rectum. Summary Diverticulosis is a condition that develops when small pouches (diverticula) form in the wall of the large intestine (colon). You may have a few pouches or many of them. This condition is most often diagnosed  during an exam for other colon problems. Treatment may include increasing the fiber in your diet, taking supplements, or taking medicines. This information is not intended to replace advice given to you by your health care provider. Make sure you discuss any questions you have with your health care provider. Document Revised: 09/23/2018 Document Reviewed: 09/23/2018 Elsevier Patient Education  [employer].

## 2021-04-17 ENCOUNTER — Other Ambulatory Visit: Payer: Self-pay

## 2021-04-17 ENCOUNTER — Ambulatory Visit
Admission: RE | Admit: 2021-04-17 | Discharge: 2021-04-17 | Disposition: A | Discharge status: Home / Self Care | Payer: BC Managed Care – PPO | Source: Ambulatory Visit | Attending: Podiatry | Admitting: Podiatry

## 2021-04-17 DIAGNOSIS — M722 Plantar fascial fibromatosis: Secondary | ICD-10-CM | POA: Diagnosis not present

## 2021-04-22 ENCOUNTER — Telehealth: Payer: Self-pay

## 2021-04-22 NOTE — Telephone Encounter (Signed)
Wife was here today seeing another provider.  She states you saw her husband last week and you all discussed a fungal infection in his finger nail and you said you would give him a cream?  I do not see anything documented in note?

## 2021-04-23 NOTE — Telephone Encounter (Signed)
Left vm

## 2021-05-01 ENCOUNTER — Ambulatory Visit: Payer: BC Managed Care – PPO | Admitting: Internal Medicine

## 2021-05-03 ENCOUNTER — Other Ambulatory Visit: Payer: Self-pay

## 2021-05-03 ENCOUNTER — Ambulatory Visit: Payer: BC Managed Care – PPO | Admitting: Internal Medicine

## 2021-05-03 VITALS — BP 116/82 | HR 65 | Temp 98.3°F | Resp 16 | Ht 63.0 in | Wt 169.0 lb

## 2021-05-03 DIAGNOSIS — B351 Tinea unguium: Secondary | ICD-10-CM

## 2021-05-03 DIAGNOSIS — L918 Other hypertrophic disorders of the skin: Secondary | ICD-10-CM | POA: Diagnosis not present

## 2021-05-03 DIAGNOSIS — R1032 Left lower quadrant pain: Secondary | ICD-10-CM | POA: Diagnosis not present

## 2021-05-03 MED ORDER — TERBINAFINE HCL 1 % EX CREA: 1.0000 "application " | TOPICAL_CREAM | Freq: Two times a day (BID) | CUTANEOUS | 0 refills | Status: DC

## 2021-05-03 NOTE — Patient Instructions (Addendum)
It was great seeing you today!  Plan discussed at today's visit: -Blood work ordered today, results will be uploaded to MyChart.  -CT ordered, we will call you to set up this test -Cryotherapy done today - might take a second treatment to completely remove skin tag, can take Tylenol or Ibuprofen for pain -Cream for fingernail sent to pharmacy   Follow up in: as needed   Take care and let us know if you have any questions or concerns prior to your next visit.  Dr. Caralee Ates

## 2021-05-03 NOTE — Progress Notes (Signed)
Established Patient Office Visit  Subjective:  Patient ID: Carl Mcfarland, male    DOB: 28-Jan-1971  Age: 51 y.o. MRN: [id number]  CC:  Chief Complaint  Patient presents with   Follow-up   Abdominal Pain   finger nail fungus    HPI Carl Mcfarland presents for follow up on LLQ pain. He was seen in the office 2 weeks ago and was treated for diverticulitis with Augmentin and Flagyl. Today he states that he finished antibiotics completely and reports no side effects, however abdominal pain still present in the LLQ. Continues to be sharp, non-radiating. Before it was a 10/10 in severity, now 5-6/10. No fever, chills, no nausea/vomiting, no blood in stools, no bowel changes.   Also wants to discuss nail change on left pointer finger. It started turning black on the edge a year ago and is gradually getting worse. No trauma, no other nails affected.   Also would like skin tag on right side of neck removed.   Past Medical History:  Diagnosis Date   GERD (gastroesophageal reflux disease)    OCC    History of kidney stones    Low testosterone    Vitamin B12 deficiency    Vitamin D deficiency disease     Past Surgical History:  Procedure Laterality Date   GANGLION CYST EXCISION Right 03/20/2016   Procedure: REMOVAL GANGLION OF WRIST;  Surgeon: Kennedy Bucker, MD;  Location: [facility];  Service: Orthopedics;  Laterality: Right;   HERNIA REPAIR     umbilical   KNEE ARTHROSCOPY WITH ANTERIOR CRUCIATE LIGAMENT (ACL) REPAIR WITH HAMSTRING GRAFT Right 02/24/2020   Procedure: Right ACL reconstruction using quadriceps tendon autograft, medial and lateral meniscus repair vs partial meniscectomy and possible chondroplasty - Dedra Skeens to Assist;  Surgeon: Signa Kell, MD;  Location: [facility];  Service: Orthopedics;  Laterality: Right;  Dedra Skeens to Assist. AM start time.   KNEE SURGERY Right Dec. 2010    Family History  Problem Relation Age of Onset   Diabetes Mother    Heart attack  Maternal Grandfather    Dementia Paternal Grandfather     Social History   Socioeconomic History   Marital status: Married    Spouse name: Not on file   Number of children: Not on file   Years of education: Not on file   Highest education level: Not on file  Occupational History   Not on file  Tobacco Use   Smoking status: Never   Smokeless tobacco: Never  Vaping Use   Vaping Use: Never used  Substance and Sexual Activity   Alcohol use: No   Drug use: No    Comment: past use   Sexual activity: Not Currently  Other Topics Concern   Not on file  Social History Narrative   Not on file   Social Determinants of Health   Financial Resource Strain: Low Risk    Difficulty of Paying Living Expenses: Not hard at all  Food Insecurity: No Food Insecurity   Worried About Programme researcher, broadcasting/film/video in the Last Year: Never true   Ran Out of Food in the Last Year: Never true  Transportation Needs: No Transportation Needs   Lack of Transportation (Medical): No   Lack of Transportation (Non-Medical): No  Physical Activity: Inactive   Days of Exercise per Week: 0 days   Minutes of Exercise per Session: 0 min  Stress: No Stress Concern Present   Feeling of Stress : Not at all  Social Connections: Engineer, building services of Communication with Friends and Family: More than three times a week   Frequency of Social Gatherings with Friends and Family: More than three times a week   Attends Religious Services: 1 to 4 times per year   Active Member of [employer] or Organizations: No   Attends Music therapist: 1 to 4 times per year   Marital Status: Married  Human resources officer Violence: Not At Risk   Fear of Current or Ex-Partner: No   Emotionally Abused: No   Physically Abused: No   Sexually Abused: No    Outpatient Medications Prior to Visit  Medication Sig Dispense Refill   ascorbic acid (VITAMIN C) 500 MG tablet Take 500 mg by mouth daily.     cholecalciferol (VITAMIN D3)  25 MCG (1000 UNIT) tablet Take 1,000 Units by mouth daily.     No facility-administered medications prior to visit.    No Known Allergies  ROS Review of Systems  Constitutional:  Negative for chills and fever.  Respiratory:  Negative for cough.   Gastrointestinal:  Positive for abdominal pain. Negative for abdominal distention, blood in stool, constipation, diarrhea, nausea and vomiting.  Genitourinary:  Negative for dysuria, frequency and hematuria.     Objective:    Physical Exam Constitutional:      Appearance: He is well-developed.  HENT:     Head: Normocephalic and atraumatic.  Eyes:     Conjunctiva/sclera: Conjunctivae normal.  Cardiovascular:     Rate and Rhythm: Normal rate and regular rhythm.  Pulmonary:     Effort: Pulmonary effort is normal.     Breath sounds: Normal breath sounds.  Abdominal:     General: Bowel sounds are normal. There is no distension.     Palpations: Abdomen is soft.     Tenderness: There is abdominal tenderness. There is no right CVA tenderness, left CVA tenderness, guarding or rebound.     Comments: Tenderness to palpation only in LLQ  Musculoskeletal:     Right lower leg: No edema.     Left lower leg: No edema.  Skin:    General: Skin is warm and dry.     Comments: Small skin tag on right side of neck  Neurological:     General: No focal deficit present.     Mental Status: He is alert. Mental status is at baseline.  Psychiatric:        Mood and Affect: Mood normal.        Behavior: Behavior normal.    BP 116/82    Pulse 65    Temp 98.3 F (36.8 C)    Resp 16    Ht 5\' 3"  (1.6 m)    Wt 169 lb (76.7 kg)    SpO2 98%    BMI 29.94 kg/m  Wt Readings from Last 3 Encounters:  04/15/21 169 lb 8 oz (76.9 kg)  08/27/20 167 lb 6.4 oz (75.9 kg)  02/24/20 165 lb (74.8 kg)     Health Maintenance Due  Topic Date Due   Fecal DNA (Cologuard)  Never done   COVID-19 Vaccine (4 - Booster for Pfizer series) 05/14/2020    There are no  preventive care reminders to display for this patient.  Lab Results  Component Value Date   TSH 1.37 07/13/2019   Lab Results  Component Value Date   WBC 5.5 08/27/2020   HGB 15.9 08/27/2020   HCT 46.2 08/27/2020   MCV 91.7 08/27/2020  PLT 239 08/27/2020   Lab Results  Component Value Date   NA 139 08/27/2020   K 4.4 08/27/2020   CO2 30 08/27/2020   GLUCOSE 82 08/27/2020   BUN 16 08/27/2020   CREATININE 0.93 08/27/2020   BILITOT 0.3 08/27/2020   ALKPHOS 67 08/20/2016   AST 18 08/27/2020   ALT 26 08/27/2020   PROT 7.0 08/27/2020   ALBUMIN 4.1 08/20/2016   CALCIUM 9.1 08/27/2020   ANIONGAP 8 04/06/2019   Lab Results  Component Value Date   CHOL 179 08/27/2020   Lab Results  Component Value Date   HDL 47 08/27/2020   Lab Results  Component Value Date   LDLCALC 106 (H) 08/27/2020   Lab Results  Component Value Date   TRIG 144 08/27/2020   Lab Results  Component Value Date   CHOLHDL 3.8 08/27/2020   No results found for: HGBA1C    Assessment & Plan:   1. Left lower quadrant abdominal pain: Treated for diverticulitis but still 6/10 pain. Labs today, CT A/P to rule out worsening diverticulitis or other pathology to explain pain, no other associated symptoms.   - CBC w/Diff/Platelet - COMPLETE METABOLIC PANEL WITH GFR - CT Abdomen Pelvis Wo Contrast; Future - Lipase  2. Onychomycosis:   - terbinafine (LAMISIL) 1 % cream; Apply 1 application topically 2 (two) times daily.  Dispense: 30 g; Refill: 0  3. Skin tag: After obtaining consent from the patient, cryotherapy was applied 5 times to the skin tag and surrounding skin. Noted no overlying erythema, induration, or other signs of local infection. Advised to call if fevers/chills, erythema, induration, drainage, or persistent bleeding. May need second treatment to completely remove skin tag, call to schedule.    Follow-up: Return if symptoms worsen or fail to improve.    Teodora Medici, DO

## 2021-05-04 LAB — COMPLETE METABOLIC PANEL WITHOUT GFR
AG Ratio: 1.9 (calc) (ref 1.0–2.5)
ALT: 26 U/L (ref 9–46)
AST: 17 U/L (ref 10–35)
Albumin: 4.3 g/dL (ref 3.6–5.1)
Alkaline phosphatase (APISO): 62 U/L (ref 35–144)
BUN: 19 mg/dL (ref 7–25)
CO2: 27 mmol/L (ref 20–32)
Calcium: 9.4 mg/dL (ref 8.6–10.3)
Chloride: 104 mmol/L (ref 98–110)
Creat: 0.93 mg/dL (ref 0.70–1.30)
Globulin: 2.3 g/dL (ref 1.9–3.7)
Glucose, Bld: 87 mg/dL (ref 65–99)
Potassium: 4 mmol/L (ref 3.5–5.3)
Sodium: 137 mmol/L (ref 135–146)
Total Bilirubin: 0.4 mg/dL (ref 0.2–1.2)
Total Protein: 6.6 g/dL (ref 6.1–8.1)
eGFR: 99 mL/min/1.73m2

## 2021-05-04 LAB — CBC WITH DIFFERENTIAL/PLATELET
Absolute Monocytes: 464 cells/uL (ref 200–950)
Basophils Absolute: 43 cells/uL (ref 0–200)
Basophils Relative: 0.7 %
Eosinophils Absolute: 79 cells/uL (ref 15–500)
Eosinophils Relative: 1.3 %
HCT: 43.7 % (ref 38.5–50.0)
Hemoglobin: 15.2 g/dL (ref 13.2–17.1)
Lymphs Abs: 2050 cells/uL (ref 850–3900)
MCH: 32.1 pg (ref 27.0–33.0)
MCHC: 34.8 g/dL (ref 32.0–36.0)
MCV: 92.2 fL (ref 80.0–100.0)
MPV: 10.2 fL (ref 7.5–12.5)
Monocytes Relative: 7.6 %
Neutro Abs: 3465 cells/uL (ref 1500–7800)
Neutrophils Relative %: 56.8 %
Platelets: 205 10*3/uL (ref 140–400)
RBC: 4.74 10*6/uL (ref 4.20–5.80)
RDW: 12.8 % (ref 11.0–15.0)
Total Lymphocyte: 33.6 %
WBC: 6.1 10*3/uL (ref 3.8–10.8)

## 2021-05-04 LAB — LIPASE: Lipase: 32 U/L (ref 7–60)

## 2021-05-09 ENCOUNTER — Other Ambulatory Visit: Payer: Self-pay

## 2021-05-09 ENCOUNTER — Encounter: Payer: Self-pay | Admitting: Internal Medicine

## 2021-05-09 ENCOUNTER — Ambulatory Visit: Payer: BC Managed Care – PPO | Admitting: Internal Medicine

## 2021-05-09 VITALS — BP 112/62 | HR 65 | Temp 98.0°F | Resp 16 | Ht 63.0 in | Wt 165.5 lb

## 2021-05-09 DIAGNOSIS — M722 Plantar fascial fibromatosis: Secondary | ICD-10-CM

## 2021-05-09 DIAGNOSIS — Z01818 Encounter for other preprocedural examination: Secondary | ICD-10-CM

## 2021-05-09 NOTE — Progress Notes (Signed)
? ?Established Patient Office Visit ? ?Subjective:  ?Patient ID: Carl Mcfarland, male    DOB: [birth year]  Age: 51 y.o. MRN: [id number] ? ?CC: No chief complaint on file. ? ? ?HPI ?Carl Mcfarland presents for follow up on skin tag removal and surgical pre-op evaluation. No bleeding disorders or abnormal bleeding.  ? ?Type of Surgery: plantar fasciotomy left ?Date of Surgery: not yet scheduled ?Location of Surgery: [facility] ?Hx of surgical complications: None, no poor healing  ?Hx of anesthesia complications: None ?Hx of MI/stenting, COPD, DM: None ?Hx of smoking? ETOH? Drug use? No ?Revised Cardiac Risk Index for major cardiac event (RCRI): 0.02%, low risk ?Pulmonary Risk Score (ARISCAT): low risk  ?Labwork: no abnormalities from 05/03/21 ?This patient is low pulm risk and low cardiac risk. ?Patient may proceed with low risk procedure as previously planned. ? ? ?Past Medical History:  ?Diagnosis Date  ? GERD (gastroesophageal reflux disease)   ? OCC   ? History of kidney stones   ? Low testosterone   ? Vitamin B12 deficiency   ? Vitamin D deficiency disease   ? ? ?Past Surgical History:  ?Procedure Laterality Date  ? GANGLION CYST EXCISION Right 03/20/2016  ? Procedure: REMOVAL GANGLION OF WRIST;  Surgeon: Hessie Knows, MD;  Location: [facility];  Service: Orthopedics;  Laterality: Right;  ? HERNIA REPAIR    ? umbilical  ? KNEE ARTHROSCOPY WITH ANTERIOR CRUCIATE LIGAMENT (ACL) REPAIR WITH HAMSTRING GRAFT Right 02/24/2020  ? Procedure: Right ACL reconstruction using quadriceps tendon autograft, medial and lateral meniscus repair vs partial meniscectomy and possible chondroplasty - Reche Dixon to Assist;  Surgeon: Leim Fabry, MD;  Location: [facility];  Service: Orthopedics;  Laterality: Right;  Reche Dixon to Assist. AM start time.  ? KNEE SURGERY Right Dec. 2010  ? ? ?Family History  ?Problem Relation Age of Onset  ? Diabetes Mother   ? Heart attack Maternal Grandfather   ? Dementia Paternal Grandfather    ? ? ?Social History  ? ?Socioeconomic History  ? Marital status: Married  ?  Spouse name: Not on file  ? Number of children: Not on file  ? Years of education: Not on file  ? Highest education level: Not on file  ?Occupational History  ? Not on file  ?Tobacco Use  ? Smoking status: Never  ? Smokeless tobacco: Never  ?Vaping Use  ? Vaping Use: Never used  ?Substance and Sexual Activity  ? Alcohol use: No  ? Drug use: No  ?  Comment: past use  ? Sexual activity: Not Currently  ?Other Topics Concern  ? Not on file  ?Social History Narrative  ? Not on file  ? ?Social Determinants of Health  ? ?Financial Resource Strain: Low Risk   ? Difficulty of Paying Living Expenses: Not hard at all  ?Food Insecurity: No Food Insecurity  ? Worried About Charity fundraiser in the Last Year: Never true  ? Ran Out of Food in the Last Year: Never true  ?Transportation Needs: No Transportation Needs  ? Lack of Transportation (Medical): No  ? Lack of Transportation (Non-Medical): No  ?Physical Activity: Inactive  ? Days of Exercise per Week: 0 days  ? Minutes of Exercise per Session: 0 min  ?Stress: No Stress Concern Present  ? Feeling of Stress : Not at all  ?Social Connections: Socially Integrated  ? Frequency of Communication with Friends and Family: More than three times a week  ? Frequency of Social Gatherings with  Friends and Family: More than three times a week  ? Attends Religious Services: 1 to 4 times per year  ? Active Member of Clubs or Organizations: No  ? Attends Archivist Meetings: 1 to 4 times per year  ? Marital Status: Married  ?Intimate Partner Violence: Not At Risk  ? Fear of Current or Ex-Partner: No  ? Emotionally Abused: No  ? Physically Abused: No  ? Sexually Abused: No  ? ? ?Outpatient Medications Prior to Visit  ?Medication Sig Dispense Refill  ? ascorbic acid (VITAMIN C) 500 MG tablet Take 500 mg by mouth daily.    ? cholecalciferol (VITAMIN D3) 25 MCG (1000 UNIT) tablet Take 1,000 Units by mouth  daily.    ? terbinafine (LAMISIL) 1 % cream Apply 1 application topically 2 (two) times daily. 30 g 0  ? ?No facility-administered medications prior to visit.  ? ? ?No Known Allergies ? ?ROS ?Review of Systems  ?Constitutional:  Negative for chills and fever.  ?Eyes:  Negative for visual disturbance.  ?Respiratory:  Negative for cough and shortness of breath.   ?Cardiovascular:  Negative for chest pain and palpitations.  ?Gastrointestinal:  Negative for abdominal pain.  ? ?  ?Objective:  ?  ?Physical Exam ?Constitutional:   ?   Appearance: Normal appearance.  ?HENT:  ?   Head: Normocephalic and atraumatic.  ?Eyes:  ?   Conjunctiva/sclera: Conjunctivae normal.  ?Cardiovascular:  ?   Rate and Rhythm: Normal rate and regular rhythm.  ?Pulmonary:  ?   Effort: Pulmonary effort is normal.  ?   Breath sounds: Normal breath sounds.  ?Musculoskeletal:  ?   Right lower leg: No edema.  ?   Left lower leg: No edema.  ?Skin: ?   General: Skin is warm and dry.  ?Neurological:  ?   General: No focal deficit present.  ?   Mental Status: He is alert. Mental status is at baseline.  ?Psychiatric:     ?   Mood and Affect: Mood normal.     ?   Behavior: Behavior normal.  ? ? ?BP 112/62   Pulse 65   Temp 98 ?F (36.7 ?C)   Resp 16   Ht $R'5\' 3"'ZS$  (1.6 m)   Wt 165 lb 8 oz (75.1 kg)   SpO2 99%   BMI 29.32 kg/m?  ?Wt Readings from Last 3 Encounters:  ?05/03/21 169 lb (76.7 kg)  ?04/15/21 169 lb 8 oz (76.9 kg)  ?08/27/20 167 lb 6.4 oz (75.9 kg)  ? ? ? ?Health Maintenance Due  ?Topic Date Due  ? Fecal DNA (Cologuard)  Never done  ? COVID-19 Vaccine (4 - Booster for Pfizer series) 05/14/2020  ? ? ?There are no preventive care reminders to display for this patient. ? ?Lab Results  ?Component Value Date  ? TSH 1.37 07/13/2019  ? ?Lab Results  ?Component Value Date  ? WBC 6.1 05/03/2021  ? HGB 15.2 05/03/2021  ? HCT 43.7 05/03/2021  ? MCV 92.2 05/03/2021  ? PLT 205 05/03/2021  ? ?Lab Results  ?Component Value Date  ? NA 137 05/03/2021  ? K 4.0  05/03/2021  ? CO2 27 05/03/2021  ? GLUCOSE 87 05/03/2021  ? BUN 19 05/03/2021  ? CREATININE 0.93 05/03/2021  ? BILITOT 0.4 05/03/2021  ? ALKPHOS 67 08/20/2016  ? AST 17 05/03/2021  ? ALT 26 05/03/2021  ? PROT 6.6 05/03/2021  ? ALBUMIN 4.1 08/20/2016  ? CALCIUM 9.4 05/03/2021  ? ANIONGAP 8 04/06/2019  ?  EGFR 99 05/03/2021  ? ?Lab Results  ?Component Value Date  ? CHOL 179 08/27/2020  ? ?Lab Results  ?Component Value Date  ? HDL 47 08/27/2020  ? ?Lab Results  ?Component Value Date  ? [place] 106 (H) 08/27/2020  ? ?Lab Results  ?Component Value Date  ? TRIG 144 08/27/2020  ? ?Lab Results  ?Component Value Date  ? CHOLHDL 3.8 08/27/2020  ? ?No results found for: HGBA1C ? ?  ?Assessment & Plan:  ? ?1. Pre-op evaluation/Plantar fasciitis of left foot: Medically optimized for procedure, may proceed as previously planned. ? ? ?Follow-up: Return if symptoms worsen or fail to improve.  ? ? ?Teodora Medici, DO ?

## 2021-05-10 ENCOUNTER — Other Ambulatory Visit: Payer: Self-pay | Admitting: Podiatry

## 2021-05-23 ENCOUNTER — Ambulatory Visit
Admission: RE | Admit: 2021-05-23 | Discharge: 2021-05-23 | Disposition: A | Discharge status: Home / Self Care | Payer: BC Managed Care – PPO | Source: Ambulatory Visit | Attending: Internal Medicine | Admitting: Internal Medicine

## 2021-05-23 ENCOUNTER — Other Ambulatory Visit: Payer: Self-pay

## 2021-05-23 ENCOUNTER — Encounter: Payer: Self-pay | Admitting: Podiatry

## 2021-05-23 DIAGNOSIS — R1032 Left lower quadrant pain: Secondary | ICD-10-CM | POA: Insufficient documentation

## 2021-05-27 NOTE — Discharge Instructions (Addendum)
[facility] ? ?POST OPERATIVE INSTRUCTIONS FOR DR. Vickki Muff AND DR. Keyani Rigdon ?[place] ? ? ?Take your medication as prescribed.  Pain medication should be taken only as needed.  If pain is still uncontrolled then would recommend taking Tylenol or ibuprofen between pain medication doses.  If pain is still severe then would recommend taking 1 pain tablet every 4 hours.  If still severe after that then try taking 2 pain tablets every 6 hours.  Only take pain medication for severe pain as needed. ? ?Keep the dressing clean, dry and intact.  Remain nonweightbearing at all times for the next 2 weeks to left lower extremity using crutches, knee scooter, or wheelchair. ? ?Keep your foot elevated above the heart level for the first 48 hours.  Continue elevation thereafter to improve swelling.  Recommend also placing ice to the front of the ankle for maximum 10 minutes out of every 1 hour as needed.  Continue wearing the boot though at all times as will be acting as a cast or splint. ? ?Walking to the bathroom and brief periods of walking are acceptable, unless we have instructed you to be non-weight bearing. ? ?Always wear your post-op shoe when walking.  Always use your crutches if you are to be non-weight bearing. ? ?Do not take a shower. Baths are permissible as long as the foot is kept out of the water.  ? ?Every hour you are awake:  ?Bend your knee 15 times. ?Massage calf 15 times ? ?Call [facility] [phone]) if any of the following problems occur: ?You develop a temperature or fever. ?The bandage becomes saturated with blood. ?Medication does not stop your pain. ?Injury of the foot occurs. ?Any symptoms of infection including redness, odor, or red streaks running from wound. ? ?PERIPHERAL NERVE BLOCK PATIENT INFORMATION ? ?Your surgeon has requested a peripheral nerve block for your surgery. This anesthetic technique provides excellent  post-operative pain relief for you in a safe and effective manner. It will also help reduce the risk of nausea and vomiting and allow earlier discharge from the hospital.   ?The block is performed under sedation with ultrasound guidance prior to your procedure. Due to the sedation, your may or may not remember the block experience. The nerve block will begin to take effect anywhere from 5 to 30 minutes after being administered. You will be transported to the operating room from your surgery after the block is completed.   ?At the end of surgery, when the anesthesia wears off, you will notice a few things. Your may not be able to move or feel the part of your body targeted by the nerve block. These are normal experiences, and they will disappear as the block wears off.  ?If you had an interscalene nerve block performed (which is common for shoulder surgery), your voice can be very hoarse and you may feel that you are not able to take as deep a breath as you did before surgery. Some patients may also notice a droopy eyelid on the affected side. These symptoms will resolve once the block wears off.  ?Pain control: ?The nerve block technique used is a single injection that can last anywhere from 1-3 days. The duration of the numbness can vary between individuals. After leaving the hospital, it is important that you begin to take your prescribed pain medication when you start to sense the nerve block wearing off. This will help you avoid unpleasant pain at the  time the nerve block wears off, which can sometimes be in the middle of the night. The block will only cover pain in the areas targeted by the nerve block so if you experience surgical pain outside of that area, please take your prescribed pain medication. ?Management of the ?numb area?: ?After a nerve block, you cannot feel pain, pressure, or temperature in the affected area so there is an increased risk for injury. You should take extra care to protect the  affected areas until sensation and movement returns. Please take caution to not come in contact with extremely hot or cold items because you will not be able to sense or protect yourself form the extremes of temperature.  You may experience some persistent numbness after the procedure by most neurological deficits resolve over time and the incidence of serious long term neurological complications attributable to peripheral nerve blocks are relatively uncommon.   ?

## 2021-05-29 ENCOUNTER — Other Ambulatory Visit: Payer: Self-pay

## 2021-05-29 ENCOUNTER — Ambulatory Visit: Payer: BC Managed Care – PPO | Admitting: Internal Medicine

## 2021-05-29 ENCOUNTER — Encounter: Payer: Self-pay | Admitting: Internal Medicine

## 2021-05-29 VITALS — BP 130/72 | Temp 98.3°F | Resp 18 | Ht 63.0 in | Wt 168.4 lb

## 2021-05-29 DIAGNOSIS — R1032 Left lower quadrant pain: Secondary | ICD-10-CM

## 2021-05-29 DIAGNOSIS — K4090 Unilateral inguinal hernia, without obstruction or gangrene, not specified as recurrent: Secondary | ICD-10-CM | POA: Diagnosis not present

## 2021-05-29 LAB — POCT URINALYSIS DIPSTICK
Bilirubin, UA: NEGATIVE
Glucose, UA: NEGATIVE
Ketones, UA: NEGATIVE
Leukocytes, UA: NEGATIVE
Nitrite, UA: NEGATIVE
Protein, UA: POSITIVE — AB
Spec Grav, UA: 1.01 (ref 1.010–1.025)
Urobilinogen, UA: 0.2 E.U./dL
pH, UA: 5 (ref 5.0–8.0)

## 2021-05-29 NOTE — Progress Notes (Signed)
? ?Established Patient Office Visit ? ?Subjective:  ?Patient ID: Carl Mcfarland, male    DOB: 1970-03-11  Age: 51 y.o. MRN: [id number] ? ?CC:  ?Chief Complaint  ?Patient presents with  ? Abdominal Pain  ?  Left lower quadrant for 2 months  ? ? ?HPI ?Carl Mcfarland presents for follow up on abdominal pain. Located in the LLQ. First treated about 6 weeks ago for diverticulitis with Flagyl and Augmentin. Antibiotics helped pain somewhat but it had returned on follow up. Continues to be sharp, non-radiating in the LLQ. Before it was a 10/10 in severity, now 5-6/10. No fever, chills, no nausea/vomiting, no blood in stools, no bowel changes. CT A/P 05/23/21 showing no acute abdominopelvic findings. Did show a small fat containing inguinal hernia vs. Cord lipoma but no evidence of bowel wall thickening, distention or inflammatory changes. Today he states that the pain is still present and still about a 5/10. Pain is worse when he stands up from sitting down and when he twists to the left or when lifting or straining. Had some sharp pain in the right testicle for 2-3 days but this spontaneously resolved. No swelling of the testicles, no urinary symptoms, fevers, nausea, pain/blood with ejaculation. Is sexually active with his wife, no STD concerns. No colonoscopy but did send in Cologuard a few days ago, waiting for results.  ? ?Past Medical History:  ?Diagnosis Date  ? GERD (gastroesophageal reflux disease)   ? OCC   ? History of kidney stones   ? Low testosterone   ? Sleep apnea   ? No CPAP  ? Vitamin B12 deficiency   ? Vitamin D deficiency disease   ? ? ?Past Surgical History:  ?Procedure Laterality Date  ? GANGLION CYST EXCISION Right 03/20/2016  ? Procedure: REMOVAL GANGLION OF WRIST;  Surgeon: Hessie Knows, MD;  Location: [facility] ORS;  Service: Orthopedics;  Laterality: Right;  ? HERNIA REPAIR    ? umbilical  ? KNEE ARTHROSCOPY WITH ANTERIOR CRUCIATE LIGAMENT (ACL) REPAIR WITH HAMSTRING GRAFT Right 02/24/2020  ? Procedure:  Right ACL reconstruction using quadriceps tendon autograft, medial and lateral meniscus repair vs partial meniscectomy and possible chondroplasty - Reche Dixon to Assist;  Surgeon: Leim Fabry, MD;  Location: [facility] OR[facility];  Service: Orthopedics;  Laterality: Right;  Reche Dixon to Assist. AM start time.  ? KNEE SURGERY Right Dec. 2010  ? ? ?Family History  ?Problem Relation Age of Onset  ? Diabetes Mother   ? Heart attack Maternal Grandfather   ? Dementia Paternal Grandfather   ? ? ?Social History  ? ?Socioeconomic History  ? Marital status: Married  ?  Spouse name: Not on file  ? Number of children: Not on file  ? Years of education: Not on file  ? Highest education level: Not on file  ?Occupational History  ? Not on file  ?Tobacco Use  ? Smoking status: Never  ? Smokeless tobacco: Never  ?Vaping Use  ? Vaping Use: Never used  ?Substance and Sexual Activity  ? Alcohol use: No  ? Drug use: No  ?  Comment: past use  ? Sexual activity: Not Currently  ?Other Topics Concern  ? Not on file  ?Social History Narrative  ? Not on file  ? ?Social Determinants of Health  ? ?Financial Resource Strain: Low Risk   ? Difficulty of Paying Living Expenses: Not hard at all  ?Food Insecurity: No Food Insecurity  ? Worried About Charity fundraiser in the Last Year:  Never true  ? Ran Out of Food in the Last Year: Never true  ?Transportation Needs: No Transportation Needs  ? Lack of Transportation (Medical): No  ? Lack of Transportation (Non-Medical): No  ?Physical Activity: Inactive  ? Days of Exercise per Week: 0 days  ? Minutes of Exercise per Session: 0 min  ?Stress: No Stress Concern Present  ? Feeling of Stress : Not at all  ?Social Connections: Socially Integrated  ? Frequency of Communication with Friends and Family: More than three times a week  ? Frequency of Social Gatherings with Friends and Family: More than three times a week  ? Attends Religious Services: 1 to 4 times per year  ? Active Member of Clubs or Organizations: No  ?  Attends Archivist Meetings: 1 to 4 times per year  ? Marital Status: Married  ?Intimate Partner Violence: Not At Risk  ? Fear of Current or Ex-Partner: No  ? Emotionally Abused: No  ? Physically Abused: No  ? Sexually Abused: No  ? ? ?Outpatient Medications Prior to Visit  ?Medication Sig Dispense Refill  ? ascorbic acid (VITAMIN C) 500 MG tablet Take 500 mg by mouth daily. (Patient not taking: Reported on 05/23/2021)    ? cholecalciferol (VITAMIN D3) 25 MCG (1000 UNIT) tablet Take 1,000 Units by mouth daily. (Patient not taking: Reported on 05/23/2021)    ? terbinafine (LAMISIL) 1 % cream Apply 1 application topically 2 (two) times daily. (Patient not taking: Reported on 05/23/2021) 30 g 0  ? ?No facility-administered medications prior to visit.  ? ? ?No Known Allergies ? ?ROS ?Review of Systems  ?Constitutional:  Negative for chills and fever.  ?Gastrointestinal:  Positive for abdominal pain. Negative for blood in stool, constipation, diarrhea, nausea and vomiting.  ?Genitourinary:  Positive for testicular pain. Negative for dysuria, flank pain, frequency, hematuria, penile discharge, penile pain, scrotal swelling and urgency.  ?Skin: Negative.   ? ?  ?Objective:  ?  ?Physical Exam ?Constitutional:   ?   Appearance: Normal appearance.  ?HENT:  ?   Head: Normocephalic and atraumatic.  ?Eyes:  ?   Conjunctiva/sclera: Conjunctivae normal.  ?Cardiovascular:  ?   Rate and Rhythm: Normal rate and regular rhythm.  ?Pulmonary:  ?   Effort: Pulmonary effort is normal.  ?   Breath sounds: Normal breath sounds.  ?Abdominal:  ?   General: There is no distension.  ?   Palpations: Abdomen is soft.  ?   Tenderness: There is abdominal tenderness. There is no right CVA tenderness, left CVA tenderness, guarding or rebound.  ?   Comments: Consistent LLQ pain, no inguinal hernia noted on exam  ?Genitourinary: ?   Penis: Normal.   ?   Testes: Normal.  ?Musculoskeletal:  ?   Right lower leg: No edema.  ?   Left lower leg: No  edema.  ?Skin: ?   General: Skin is warm and dry.  ?Neurological:  ?   General: No focal deficit present.  ?   Mental Status: He is alert. Mental status is at baseline.  ?Psychiatric:     ?   Mood and Affect: Mood normal.     ?   Behavior: Behavior normal.  ? ? ?BP 130/72   Temp 98.3 ?F (36.8 ?C) (Oral)   Resp 18   Ht $R'5\' 3"'KW$  (1.6 m)   Wt 168 lb 6.4 oz (76.4 kg)   SpO2 98%   BMI 29.83 kg/m?  ?Wt Readings from Last 3 Encounters:  ?  05/09/21 165 lb 8 oz (75.1 kg)  ?05/03/21 169 lb (76.7 kg)  ?04/15/21 169 lb 8 oz (76.9 kg)  ? ? ? ?Health Maintenance Due  ?Topic Date Due  ? Fecal DNA (Cologuard)  Never done  ? COVID-19 Vaccine (4 - Booster for Pfizer series) 05/14/2020  ? ? ?There are no preventive care reminders to display for this patient. ? ?Lab Results  ?Component Value Date  ? TSH 1.37 07/13/2019  ? ?Lab Results  ?Component Value Date  ? WBC 6.1 05/03/2021  ? HGB 15.2 05/03/2021  ? HCT 43.7 05/03/2021  ? MCV 92.2 05/03/2021  ? PLT 205 05/03/2021  ? ?Lab Results  ?Component Value Date  ? NA 137 05/03/2021  ? K 4.0 05/03/2021  ? CO2 27 05/03/2021  ? GLUCOSE 87 05/03/2021  ? BUN 19 05/03/2021  ? CREATININE 0.93 05/03/2021  ? BILITOT 0.4 05/03/2021  ? ALKPHOS 67 08/20/2016  ? AST 17 05/03/2021  ? ALT 26 05/03/2021  ? PROT 6.6 05/03/2021  ? ALBUMIN 4.1 08/20/2016  ? CALCIUM 9.4 05/03/2021  ? ANIONGAP 8 04/06/2019  ? EGFR 99 05/03/2021  ? ?Lab Results  ?Component Value Date  ? CHOL 179 08/27/2020  ? ?Lab Results  ?Component Value Date  ? HDL 47 08/27/2020  ? ?Lab Results  ?Component Value Date  ? [place] 106 (H) 08/27/2020  ? ?Lab Results  ?Component Value Date  ? TRIG 144 08/27/2020  ? ?Lab Results  ?Component Value Date  ? CHOLHDL 3.8 08/27/2020  ? ?No results found for: HGBA1C ? ?  ?Assessment & Plan:  ? ?1. Left lower quadrant abdominal pain/Unilateral inguinal hernia without obstruction or gangrene, recurrence not specified: CT abdomen virtually negative with exception of small fat filled inguinal hernia, not  appreciated on exam. Location of pain more in the LLQ but did have some right testicular pain. UA with trace blood but no signs of infection. Cologuard pending. Will refer to surgery for evaluation of hernia an

## 2021-05-29 NOTE — Patient Instructions (Signed)
It was great seeing you today! ? ?Plan discussed at today's visit: ?-Urine checked today ?-Referral to general surgery placed for evaluation of hernia ? ?Follow up in: as needed  ? ?Take care and let us know if you have any questions or concerns prior to your next visit. ? ?Dr. Caralee Ates ? ?

## 2021-05-30 ENCOUNTER — Other Ambulatory Visit: Payer: Self-pay

## 2021-05-30 ENCOUNTER — Ambulatory Visit
Admission: RE | Admit: 2021-05-30 | Discharge: 2021-05-30 | Disposition: A | Discharge status: Home / Self Care | Payer: BC Managed Care – PPO | Source: Ambulatory Visit | Attending: Podiatry | Admitting: Podiatry

## 2021-05-30 ENCOUNTER — Encounter: Payer: Self-pay | Admitting: Podiatry

## 2021-05-30 ENCOUNTER — Ambulatory Visit: Payer: BC Managed Care – PPO | Admitting: Anesthesiology

## 2021-05-30 ENCOUNTER — Ambulatory Visit: Payer: Self-pay

## 2021-05-30 ENCOUNTER — Encounter
Admission: RE | Disposition: A | Discharge status: Home / Self Care | Payer: Self-pay | Source: Ambulatory Visit | Attending: Podiatry

## 2021-05-30 DIAGNOSIS — X58XXXA Exposure to other specified factors, initial encounter: Secondary | ICD-10-CM | POA: Diagnosis not present

## 2021-05-30 DIAGNOSIS — G4730 Sleep apnea, unspecified: Secondary | ICD-10-CM | POA: Insufficient documentation

## 2021-05-30 DIAGNOSIS — S96922A Laceration of unspecified muscle and tendon at ankle and foot level, left foot, initial encounter: Secondary | ICD-10-CM | POA: Diagnosis not present

## 2021-05-30 DIAGNOSIS — M722 Plantar fascial fibromatosis: Secondary | ICD-10-CM | POA: Diagnosis not present

## 2021-05-30 DIAGNOSIS — K219 Gastro-esophageal reflux disease without esophagitis: Secondary | ICD-10-CM | POA: Insufficient documentation

## 2021-05-30 DIAGNOSIS — M7732 Calcaneal spur, left foot: Secondary | ICD-10-CM | POA: Diagnosis not present

## 2021-05-30 HISTORY — PX: HEEL SPUR RESECTION: SHX6410

## 2021-05-30 HISTORY — PX: PLANTAR FASCIA RELEASE: SHX2239

## 2021-05-30 SURGERY — RELEASE, FASCIA, PLANTAR
Anesthesia: Regional | Site: Heel | Laterality: Left

## 2021-05-30 MED ORDER — PROPOFOL 10 MG/ML IV BOLUS
INTRAVENOUS | Status: DC | PRN
  Administered 2021-05-30: 170 mg via INTRAVENOUS

## 2021-05-30 MED ORDER — ACETAMINOPHEN 160 MG/5ML PO SOLN: 325.0000 mg | ORAL | Status: DC | PRN

## 2021-05-30 MED ORDER — ONDANSETRON HCL 4 MG/2ML IJ SOLN
INTRAMUSCULAR | Status: DC | PRN
  Administered 2021-05-30: 4 mg via INTRAVENOUS

## 2021-05-30 MED ORDER — FENTANYL CITRATE (PF) 100 MCG/2ML IJ SOLN
INTRAMUSCULAR | Status: DC | PRN
  Administered 2021-05-30: 50 ug via INTRAVENOUS

## 2021-05-30 MED ORDER — 0.9 % SODIUM CHLORIDE (POUR BTL) OPTIME
TOPICAL | Status: DC | PRN
  Administered 2021-05-30: 1000 mL

## 2021-05-30 MED ORDER — CEFAZOLIN SODIUM-DEXTROSE 2-4 GM/100ML-% IV SOLN
2.0000 g | INTRAVENOUS | Status: AC
  Administered 2021-05-30: 2 g via INTRAVENOUS

## 2021-05-30 MED ORDER — ROPIVACAINE HCL 5 MG/ML IJ SOLN
INTRAMUSCULAR | Status: DC | PRN
  Administered 2021-05-30: 40 mL via PERINEURAL

## 2021-05-30 MED ORDER — ONDANSETRON HCL 4 MG PO TABS: 4.0000 mg | ORAL_TABLET | Freq: Three times a day (TID) | ORAL | 0 refills | Status: DC | PRN

## 2021-05-30 MED ORDER — MIDAZOLAM HCL 5 MG/5ML IJ SOLN
INTRAMUSCULAR | Status: DC | PRN
  Administered 2021-05-30: 2 mg via INTRAVENOUS

## 2021-05-30 MED ORDER — HYDROCODONE-ACETAMINOPHEN 5-325 MG PO TABS: 1.0000 | ORAL_TABLET | Freq: Four times a day (QID) | ORAL | 0 refills | Status: AC | PRN

## 2021-05-30 MED ORDER — DEXAMETHASONE SODIUM PHOSPHATE 4 MG/ML IJ SOLN
INTRAMUSCULAR | Status: DC | PRN
  Administered 2021-05-30: 4 mg via INTRAVENOUS

## 2021-05-30 MED ORDER — OXYCODONE HCL 5 MG/5ML PO SOLN: 5.0000 mg | Freq: Once | ORAL | Status: DC | PRN

## 2021-05-30 MED ORDER — FENTANYL CITRATE PF 50 MCG/ML IJ SOSY: 25.0000 ug | PREFILLED_SYRINGE | INTRAMUSCULAR | Status: DC | PRN

## 2021-05-30 MED ORDER — OXYCODONE HCL 5 MG PO TABS: 5.0000 mg | ORAL_TABLET | Freq: Once | ORAL | Status: DC | PRN

## 2021-05-30 MED ORDER — LACTATED RINGERS IV SOLN: INTRAVENOUS | Status: DC

## 2021-05-30 MED ORDER — ASPIRIN EC 81 MG PO TBEC: 81.0000 mg | DELAYED_RELEASE_TABLET | Freq: Two times a day (BID) | ORAL | 0 refills | Status: AC

## 2021-05-30 MED ORDER — ACETAMINOPHEN 325 MG PO TABS: 325.0000 mg | ORAL_TABLET | ORAL | Status: DC | PRN

## 2021-05-30 SURGICAL SUPPLY — 43 items
BNDG CMPR STD VLCR NS LF 5.8X4 (GAUZE/BANDAGES/DRESSINGS) ×4
BNDG COHESIVE 4X5 TAN ST LF (GAUZE/BANDAGES/DRESSINGS) ×3 IMPLANT
BNDG ELASTIC 4X5.8 VLCR NS LF (GAUZE/BANDAGES/DRESSINGS) ×6 IMPLANT
BNDG ESMARK 4X12 TAN STRL LF (GAUZE/BANDAGES/DRESSINGS) ×3 IMPLANT
BNDG GAUZE ELAST 4 BULKY (GAUZE/BANDAGES/DRESSINGS) ×3 IMPLANT
CANISTER SUCT 1200ML W/VALVE (MISCELLANEOUS) ×3 IMPLANT
COVER LIGHT HANDLE UNIVERSAL (MISCELLANEOUS) ×6 IMPLANT
CUFF TOURN SGL QUICK 18X4 (TOURNIQUET CUFF) IMPLANT
CUFF TOURN SGL QUICK 24 (TOURNIQUET CUFF)
CUFF TRNQT CYL 24X4X16.5-23 (TOURNIQUET CUFF) IMPLANT
DRAPE FLUOR MINI C-ARM 54X84 (DRAPES) ×1 IMPLANT
DURAPREP 26ML APPLICATOR (WOUND CARE) ×3 IMPLANT
ELECT REM PT RETURN 9FT ADLT (ELECTROSURGICAL) ×3
ELECTRODE REM PT RTRN 9FT ADLT (ELECTROSURGICAL) ×2 IMPLANT
GAUZE SPONGE 4X4 12PLY STRL (GAUZE/BANDAGES/DRESSINGS) ×3 IMPLANT
GAUZE XEROFORM 1X8 LF (GAUZE/BANDAGES/DRESSINGS) ×3 IMPLANT
GAUZE XEROFORM 5X9 LF (GAUZE/BANDAGES/DRESSINGS) ×1 IMPLANT
GLOVE SURG ENC MOIS LTX SZ7 (GLOVE) ×3 IMPLANT
GLOVE SURG POLYISO LF SZ7 (GLOVE) ×6 IMPLANT
GLOVE SURG UNDER POLY LF SZ7 (GLOVE) ×3 IMPLANT
GOWN STRL REUS W/ TWL LRG LVL3 (GOWN DISPOSABLE) ×4 IMPLANT
GOWN STRL REUS W/TWL LRG LVL3 (GOWN DISPOSABLE) ×6
IV NS 250ML (IV SOLUTION) ×3
IV NS 250ML BAXH (IV SOLUTION) ×2 IMPLANT
IV NS 500ML (IV SOLUTION)
IV NS 500ML BAXH (IV SOLUTION) IMPLANT
KIT CARPAL TUNNEL (MISCELLANEOUS) ×6
KIT ESCP INSRT D SLOT CANN KN (MISCELLANEOUS) ×2 IMPLANT
KIT PRC PRB RTRGD 3ANG KNF HND (MISCELLANEOUS) ×2 IMPLANT
KIT TURNOVER KIT A (KITS) ×3 IMPLANT
NS IRRIG 500ML POUR BTL (IV SOLUTION) ×3 IMPLANT
PACK EXTREMITY ARMC (MISCELLANEOUS) ×3 IMPLANT
PADDING CAST BLEND 4X4 NS (MISCELLANEOUS) ×9 IMPLANT
RASP SM TEAR CROSS CUT (RASP) ×1 IMPLANT
SPLINT CAST 1 STEP 4X30 (MISCELLANEOUS) ×3 IMPLANT
STOCKINETTE IMPERVIOUS LG (DRAPES) ×3 IMPLANT
SUT ETHILON 3-0 (SUTURE) ×3 IMPLANT
SUT ETHILON 3-0 FS-10 30 BLK (SUTURE) ×3
SUT VIC AB 3-0 SH 27 (SUTURE)
SUT VIC AB 3-0 SH 27X BRD (SUTURE) IMPLANT
SUTURE EHLN 3-0 FS-10 30 BLK (SUTURE) IMPLANT
WAND TENDON TOPAZ 0 ANGL (MISCELLANEOUS) IMPLANT
WAND TOPAZ MICRO DEBRIDER (MISCELLANEOUS) ×1 IMPLANT

## 2021-05-30 NOTE — Anesthesia Procedure Notes (Signed)
Procedure Name: LMA Insertion ?Date/Time: 05/30/2021 7:35 AM ?Performed by: Maree Krabbe, CRNA ?Pre-anesthesia Checklist: Patient identified, Emergency Drugs available, Suction available, Timeout performed and Patient being monitored ?Patient Re-evaluated:Patient Re-evaluated prior to induction ?Oxygen Delivery Method: Circle system utilized ?Preoxygenation: Pre-oxygenation with 100% oxygen ?Induction Type: IV induction ?LMA: LMA inserted ?LMA Size: 4.0 ?Tube size: 4.0 mm ?Number of attempts: 1 ?Placement Confirmation: positive ETCO2 and breath sounds checked- equal and bilateral ?Tube secured with: Tape ? ? ? ? ?

## 2021-05-30 NOTE — Transfer of Care (Signed)
Immediate Anesthesia Transfer of Care Note ? ?Patient: Carl Mcfarland ? ?Procedure(s) Performed: PLANTAR FASCIA RELEASE (Left) ?HEEL SPUR RESECTION (Left: Heel) ? ?Patient Location: PACU ? ?Anesthesia Type: General, Regional ? ?Level of Consciousness: awake, alert  and patient cooperative ? ?Airway and Oxygen Therapy: Patient Spontanous Breathing and Patient connected to supplemental oxygen ? ?Post-op Assessment: Post-op Vital signs reviewed, Patient's Cardiovascular Status Stable, Respiratory Function Stable, Patent Airway and No signs of Nausea or vomiting ? ?Post-op Vital Signs: Reviewed and stable ? ?Complications: No notable events documented. ? ?

## 2021-05-30 NOTE — Anesthesia Postprocedure Evaluation (Signed)
Anesthesia Post Note ? ?Patient: Carl Mcfarland ? ?Procedure(s) Performed: PLANTAR FASCIA RELEASE (Left) ?HEEL SPUR RESECTION (Left: Heel) ? ? ?  ?Patient location during evaluation: PACU ?Anesthesia Type: Regional and General ?Level of consciousness: awake ?Pain management: pain level controlled ?Vital Signs Assessment: post-procedure vital signs reviewed and stable ?Respiratory status: respiratory function stable ?Cardiovascular status: stable ?Postop Assessment: no signs of nausea or vomiting ?Anesthetic complications: no ? ? ?No notable events documented. ? ?Veda Canning ? ? ? ? ? ?

## 2021-05-30 NOTE — H&P (Signed)
HISTORY AND PHYSICAL INTERVAL NOTE: ? ?05/30/2021 ? ?7:17 AM ? 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  The various methods of treatment have been discussed with the patient.  No guarantees were given.  After consideration of risks, benefits and other options for treatment, the patient has consented to surgery.  I have reviewed the patients? chart and labs.   ? ?PROCEDURE: ?LEFT ENDOSCOPIC PLANTAR FASCIOTOMY WITH TOPAZ MICRODEBRIDEMENT ?LEFT CALCANEAL HEEL SPUR RESECTION ? ? ?A history and physical examination was performed in my office.  The patient was reexamined.  There have been no changes to this history and physical examination. ? ?Rosetta Posner, DPM ? ?

## 2021-05-30 NOTE — Progress Notes (Signed)
Assisted Carl Mcfarland, ANMD with left, popliteal/saphenous, ultrasound guided block. Side rails up, monitors on throughout procedure. See vital signs in flow sheet. Tolerated Procedure well. ?

## 2021-05-30 NOTE — Anesthesia Procedure Notes (Signed)
Anesthesia Regional Block: Popliteal block  ? ?Pre-Anesthetic Checklist: , timeout performed,  Correct Patient, Correct Site, Correct Laterality,  Correct Procedure, Correct Position, site marked,  Risks and benefits discussed,  Surgical consent,  Pre-op evaluation,  At surgeon's request and post-op pain management ? ?Laterality: Left ? ?Prep: chloraprep     ?  ?Needles:  ?Injection technique: Single-shot ? ?Needle Type: Echogenic Needle   ? ? ?Needle Length: 9cm  ?Needle Gauge: 21  ? ? ? ?Additional Needles: ? ? ?Procedures:,,,, ultrasound used (permanent image in chart),,    ?Narrative:  ?Start time: 05/30/2021 6:59 AM ?End time: 05/30/2021 7:03 AM ?Injection made incrementally with aspirations every 5 mL. ? ?Performed by: Personally  ?Anesthesiologist: Jola Babinski, MD ? ?Additional Notes: ?Functioning IV was confirmed and monitors applied. Ultrasound guidance: relevant anatomy identified, needle position confirmed, local anesthetic spread visualized around nerve(s)., vascular puncture avoided.  Image printed for medical record.  Negative aspiration and no paresthesias; incremental administration of local anesthetic. The patient tolerated the procedure well. Vitals signs recorded in RN notes. 75mL injected. ? ? ? ?

## 2021-05-30 NOTE — Op Note (Signed)
PODIATRY / FOOT AND ANKLE SURGERY OPERATIVE REPORT ? ? ? ?SURGEON: Caroline More, DPM ? ?PRE-OPERATIVE DIAGNOSIS:  ?1.  Chronic left plantar fasciitis ? ?POST-OPERATIVE DIAGNOSIS: Same ? ?PROCEDURE(S): ?Left endoscopic plantar fasciotomy with Topaz microdebridement ?Left calcaneal heel spur resection ? ?HEMOSTASIS: Left ankle tourniquet ? ?ANESTHESIA: MAC ? ?ESTIMATED BLOOD LOSS: 5 cc ? ?FINDING(S): ?1.  Thickened and partially torn plantar fascia left ?2.  Large plantar calcaneal heel spur left ? ?PATHOLOGY/SPECIMEN(S): None ? ?INDICATIONS:   ?CHAMPION CORALES is a 51 y.o. male who presents with chronic left plantar heel pain.  Patient has undergone a series of conservative care measures consisting of change in shoe gear, orthoses, using walking boot, steroid injections, heel cups and physical therapy type exercises but still continues to have pain discomfort these had for several months to up to a year now.  All treatment options were discussed with the patient both conservative and surgical attempts at correction clean potential risks and complications at this time patient is elected for procedure described above.  No guarantees given.  Consent obtained prior to procedure.. ? ?DESCRIPTION: ?After obtaining full informed written consent, the patient was brought back to the operating room and placed supine upon the operating table.  The patient received IV antibiotics prior to induction.  After obtaining adequate anesthesia, the patient was prepped and draped in the standard fashion.  An Esmarch bandage was used to exsanguinate the left lower extremity and the pneumatic ankle tourniquet was inflated. ? ?Attention was then directed to the medial aspect of the left heel where a small incision was made slightly distal to the origin of the plantar fascia along the same plane that was approximately 1 to 2 cm.  The incision was deepened to the subcutaneous tissues utilizing sharp and blunt dissection care was taken to  identify and retract all vital neurovascular structures all venous contributories were cauterized necessary.  Blunt dissection was then continued to make a plane below the plantar fascia through the subcutaneous fat separating the fat from the plantar fascia from medial to lateral.  The obturator and cannula was then placed through the medial incision to the lateral heel.  A small stab incision was made to the lateral heel and the obturator and cannula was passed through the lateral incision.  The cannula was left in place and the obturator was removed.  The scope was then placed through the lateral portal and examination of the plantar fascia was performed.  There did appear to be a partial tear in the central band of the plantar fascia, the medial band still appeared to be intact and taut.  Also appeared to have increased thickness overall and some fatty degeneration to the area consistent with scar tissue.  At this time the triangular hook blade was then placed through the medial aspect of the cannula and 30 to 40% of the central band and all the medial band was cut from lateral to medial.  The flexor digitorum brevis muscle belly was able to be visualized indicating successful release of the plantar fascia.  A flush was performed with copious amounts normal sterile saline.  The plantar fascia appeared to be well incised.   ? ?Attention was then directed to the medial heel incision where a Takahashi grasper was used under fluoroscopic guidance to remove the plantar heel spur.  This area was contoured then further with the paddle rasp under fluoroscopic guidance until the heel appeared to have around a normal contour.  The surgical site was  flushed with copious amounts normal sterile saline.  The medial and lateral incisions and the heel's were then reapproximated well coapted with 3-0 nylon in a combination of simple and horizontal mattress type stitching. ? ?Attention was then directed to the plantar heel where  a grid was made on the plantar medial heel.  The grid was made and a 5 x 4 pattern.  A 0.062 K wire was then used and placed through each grade point that was made.  At this time the Topaz microdebridement instrumentation was then placed through each hole and directed through 3 different directions debriding the plantar fascia at the bottom of the foot/heel.  The instrumentation was then removed.  The surgical site was flushed with copious amounts normal sterile saline. ? ?Xeroform gauze was then placed on the incision line's and the plantar heel followed by 4 x 4 gauze, ABD, Kerlix, Ace wrap, tall cam boot.  The pneumatic ankle tourniquet was deflated and a prompt operative response was noted all digits left foot.  Patient tolerated the procedure and anesthesia well was transferred to recovery in vital signs stable vascular status intact to all toes of the left foot.  Following a period of postoperative monitoring the patient be discharged home with the appropriate orders and follow-up instructions as well as medications.  Patient to follow-up in 1 week.  Patient is to remain nonweightbearing at all times. ? ?Attention was then directed ? ?COMPLICATIONS: None ? ?CONDITION: Good, stable ? ?Caroline More, DPM ? ?

## 2021-05-30 NOTE — Anesthesia Preprocedure Evaluation (Signed)
Anesthesia Evaluation  ?Patient identified by MRN, date of birth, ID band ?Patient awake ? ? ? ?Reviewed: ?Allergy & Precautions, NPO status  ? ?Airway ?Mallampati: II ? ?TM Distance: >3 FB ? ? ? ? Dental ?  ?Pulmonary ?sleep apnea (no cpap) ,  ?  ?Pulmonary exam normal ? ? ? ? ? ? ? Cardiovascular ?negative cardio ROS ? ? ?Rhythm:Regular Rate:Normal ? ? ?  ?Neuro/Psych ?  ? GI/Hepatic ?GERD  ,  ?Endo/Other  ? ? Renal/GU ?  ? ?  ?Musculoskeletal ? ? Abdominal ?  ?Peds ? Hematology ?  ?Anesthesia Other Findings ? ? Reproductive/Obstetrics ? ?  ? ? ? ? ? ? ? ? ? ? ? ? ? ?  ?  ? ? ? ? ? ? ? ? ?Anesthesia Physical ?Anesthesia Plan ? ?ASA: 2 ? ?Anesthesia Plan: General and Regional  ? ?Post-op Pain Management: Regional block  ? ?Induction: Intravenous ? ?PONV Risk Score and Plan: 2 and Treatment may vary due to age or medical condition, Ondansetron, Dexamethasone and Midazolam ? ?Airway Management Planned: LMA ? ?Additional Equipment:  ? ?Intra-op Plan:  ? ?Post-operative Plan:  ? ?Informed Consent: I have reviewed the patients History and Physical, chart, labs and discussed the procedure including the risks, benefits and alternatives for the proposed anesthesia with the patient or authorized representative who has indicated his/her understanding and acceptance.  ? ? ? ?Dental advisory given ? ?Plan Discussed with: CRNA ? ?Anesthesia Plan Comments:   ? ? ? ? ? ? ?Anesthesia Quick Evaluation ? ?

## 2021-05-30 NOTE — Anesthesia Procedure Notes (Signed)
Anesthesia Regional Block: Adductor canal block  ? ?Pre-Anesthetic Checklist: , timeout performed,  Correct Patient, Correct Site, Correct Laterality,  Correct Procedure, Correct Position, site marked,  Risks and benefits discussed,  Surgical consent,  Pre-op evaluation,  At surgeon's request and post-op pain management ? ?Laterality: Left ? ?Prep: chloraprep     ?  ?Needles:  ?Injection technique: Single-shot ? ?Needle Type: Echogenic Needle   ? ? ?Needle Length: 9cm  ?Needle Gauge: 21  ? ? ? ?Additional Needles: ? ? ?Procedures:,,,, ultrasound used (permanent image in chart),,    ?Narrative:  ?Start time: 05/30/2021 7:04 AM ?End time: 05/30/2021 7:06 AM ?Injection made incrementally with aspirations every 5 mL. ? ?Performed by: Personally  ?Anesthesiologist: Veda Canning, MD ? ?Additional Notes: ?Functioning IV was confirmed and monitors applied. Ultrasound guidance: relevant anatomy identified, needle position confirmed, local anesthetic spread visualized around nerve(s)., vascular puncture avoided.  Image printed for medical record.  Negative aspiration and no paresthesias; incremental administration of local anesthetic. The patient tolerated the procedure well. Vitals signs recorded in RN notes. 36mL injected. ? ? ? ?

## 2021-05-31 ENCOUNTER — Encounter: Payer: Self-pay | Admitting: Podiatry

## 2021-06-02 LAB — COLOGUARD: Cologuard: NEGATIVE

## 2021-06-26 ENCOUNTER — Ambulatory Visit: Payer: BC Managed Care – PPO | Admitting: Surgery

## 2021-07-05 ENCOUNTER — Ambulatory Visit: Payer: BC Managed Care – PPO | Admitting: Surgery

## 2021-07-08 ENCOUNTER — Ambulatory Visit: Payer: BC Managed Care – PPO | Admitting: Surgery

## 2021-07-08 ENCOUNTER — Encounter: Payer: Self-pay | Admitting: Surgery

## 2021-07-08 VITALS — BP 99/66 | HR 62 | Temp 98.7°F | Ht 64.0 in | Wt 165.0 lb

## 2021-07-08 DIAGNOSIS — R1032 Left lower quadrant pain: Secondary | ICD-10-CM

## 2021-07-08 MED ORDER — CIPROFLOXACIN HCL 500 MG PO TABS: 500.0000 mg | ORAL_TABLET | Freq: Two times a day (BID) | ORAL | 0 refills | Status: AC

## 2021-07-08 MED ORDER — METRONIDAZOLE 500 MG PO TABS: 500.0000 mg | ORAL_TABLET | Freq: Two times a day (BID) | ORAL | 0 refills | Status: AC

## 2021-07-08 NOTE — Progress Notes (Signed)
?07/08/2021 ? ?Reason for Visit: Left lower quadrant abdominal pain ? ?Requesting Provider:  Margarita Mail, DO ? ?History of Present Illness: ?Carl Mcfarland is a 51 y.o. male presenting for evaluation of left lower quadrant abdominal pain.  Patient reports that he started having pain towards the end of January in the left lower quadrant and presented to his PCP for evaluation.  The patient reports that the pain was intermittent and will vary depending on what activity he would be doing.  He will feel that once the pain started, he will have to burp and then the pain will subside.  On evaluation by his PCP, there was concern for potential uncomplicated diverticulitis and was given a prescription for Augmentin and Flagyl for 5-day course.  The patient reports that he improved with antibiotic course but after it was completed, shortly after started having some intermittent discomfort in the left lower quadrant which was less severe compared to the initial episode.  This has continued and after further follow-up with PCP, he had a CT scan of the abdomen pelvis on 05/23/2021 which did not show any acute finding and perhaps small fat-containing inguinal hernias versus cord lipomas.  On my personal review of the images, I think there may be some wall thickening of the distal sigmoid colon although it is unclear whether he truly has diverticular disease or not.  The patient denies any bulging sensation in either groin and reports that the pain is more in the left lower quadrant.  Denies any constipation or diarrhea, denies any blood in his stool. ? ?Past Medical History: ?Past Medical History:  ?Diagnosis Date  ? GERD (gastroesophageal reflux disease)   ? OCC   ? History of kidney stones   ? Low testosterone   ? Sleep apnea   ? No CPAP  ? Vitamin B12 deficiency   ? Vitamin D deficiency disease   ?  ? ?Past Surgical History: ?Past Surgical History:  ?Procedure Laterality Date  ? GANGLION CYST EXCISION Right 03/20/2016  ?  Procedure: REMOVAL GANGLION OF WRIST;  Surgeon: Kennedy Bucker, MD;  Location: [facility];  Service: Orthopedics;  Laterality: Right;  ? HEEL SPUR RESECTION Left 05/30/2021  ? Procedure: HEEL SPUR RESECTION;  Surgeon: Rosetta Posner, DPM;  Location: [facility];  Service: Podiatry;  Laterality: Left;  sleep apnea  ? HERNIA REPAIR    ? umbilical  ? KNEE ARTHROSCOPY WITH ANTERIOR CRUCIATE LIGAMENT (ACL) REPAIR WITH HAMSTRING GRAFT Right 02/24/2020  ? Procedure: Right ACL reconstruction using quadriceps tendon autograft, medial and lateral meniscus repair vs partial meniscectomy and possible chondroplasty - Dedra Skeens to Assist;  Surgeon: Signa Kell, MD;  Location: [facility];  Service: Orthopedics;  Laterality: Right;  Dedra Skeens to Assist. AM start time.  ? KNEE SURGERY Right Dec. 2010  ? PLANTAR FASCIA RELEASE Left 05/30/2021  ? Procedure: PLANTAR FASCIA RELEASE;  Surgeon: Rosetta Posner, DPM;  Location: [facility];  Service: Podiatry;  Laterality: Left;  Anesthesia: Choice - preop pop/saph  ? ? ?Home Medications: ?Prior to Admission medications   ?Medication Sig Start Date End Date Taking? Authorizing Provider  ?ciprofloxacin (CIPRO) 500 MG tablet Take 1 tablet (500 mg total) by mouth 2 (two) times daily for 14 days. 07/08/21 07/22/21 Yes Nathon Stefanski, Elita Quick, MD  ?metroNIDAZOLE (FLAGYL) 500 MG tablet Take 1 tablet (500 mg total) by mouth 2 (two) times daily for 14 days. 07/08/21 07/22/21 Yes Montrez Marietta, Elita Quick, MD  ?omeprazole (PRILOSEC) 20 MG capsule Take 20 mg by mouth  daily.   Yes [provider]  ? ? ?Allergies: ?No Known Allergies ? ?Social History: ? reports that he has never smoked. He has never used smokeless tobacco. He reports that he does not drink alcohol and does not use drugs.  ? ?Family History: ?Family History  ?Problem Relation Age of Onset  ? Diabetes Mother   ? Heart attack Maternal Grandfather   ? Dementia Paternal Grandfather   ? ? ?Review of Systems: ?Review of Systems  ?Constitutional:   Negative for chills and fever.  ?HENT:  Negative for hearing loss.   ?Respiratory:  Negative for shortness of breath.   ?Cardiovascular:  Negative for chest pain.  ?Gastrointestinal:  Positive for abdominal pain. Negative for constipation, diarrhea, nausea and vomiting.  ?Genitourinary:  Negative for dysuria.  ?Musculoskeletal:  Negative for myalgias.  ?Skin:  Negative for rash.  ?Neurological:  Negative for dizziness.  ?Psychiatric/Behavioral:  Negative for depression.   ? ?Physical Exam ?BP 99/66   Pulse 62   Temp 98.7 ?F (37.1 ?C)   Ht 5\' 4"  (1.626 m)   Wt 165 lb (74.8 kg)   SpO2 97%   BMI 28.32 kg/m?  ?CONSTITUTIONAL: No acute distress, well-nourished ?HEENT:  Normocephalic, atraumatic, extraocular motion intact. ?NECK: Trachea is midline, and there is no jugular venous distension.  ?RESPIRATORY:  Lungs are clear, and breath sounds are equal bilaterally. Normal respiratory effort without pathologic use of accessory muscles. ?CARDIOVASCULAR: Heart is regular without murmurs, gallops, or rubs. ?GI: The abdomen is soft, nondistended, with some mild tenderness to palpation which is very localized in the left lower quadrant.  On exam, there is no tenderness in either groin and there is no evidence of any hernia in either groin.  He does have a well-healed umbilical incision from prior umbilical hernia repair.  ?MUSCULOSKELETAL:  Normal muscle strength and tone in all four extremities.  No peripheral edema or cyanosis. ?SKIN: Skin turgor is normal. There are no pathologic skin lesions.  ?NEUROLOGIC:  Motor and sensation is grossly normal.  Cranial nerves are grossly intact. ?PSYCH:  Alert and oriented to person, place and time. Affect is normal. ? ?Laboratory Analysis: ?Labs from 05/03/2021: ?Sodium 137, potassium 4.0, chloride 104, CO2 27, BUN 19, creatinine 0.93, total bilirubin 0.4, AST 17, ALT 26, alkaline phosphatase 62, lipase 32.  WBC 6.1, hemoglobin 15.2, hematocrit 43.7, platelets 205. ? ?Imaging: ?CT  abdomen/pelvis without contrast on 05/23/2021: ?FINDINGS: ?Lower chest: No acute abnormality. ?  ?Hepatobiliary: Normal noncontrast appearance. No gallstones, ?gallbladder wall thickening, or biliary dilatation. ?  ?Pancreas: No pancreatic ductal dilatation or surrounding ?inflammatory changes. ?  ?Spleen: Normal in size without focal abnormality. 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 ?  ?Adrenals/Urinary Tract: Adrenal glands are unremarkable. Kidneys are ?normal, without renal calculi, focal lesion, or hydronephrosis. ?Bladder is unremarkable. ?  ?Stomach/Bowel: Stomach is within normal limits. Appendix appears ?normal. Enteral contrast opacification of stomach, small bowel and ?proximal colon. No evidence of bowel wall thickening, distention, or ?inflammatory changes. ?  ?Vascular/Lymphatic: Minimal aortic atherosclerosis. No enlarged ?abdominal or pelvic lymph nodes. ?  ?Reproductive: Prostate is unremarkable. Mild engorgement of the ?seminal vesicles ?  ?Other: Umbilical herniorrhaphy. Small fat-containing inguinal ?hernias versus cord lipomas. No abdominopelvic ascites. ?  ?Musculoskeletal: No acute osseous findings. ?  ?IMPRESSION: ?No acute abdominopelvic findings. ? ?Assessment and Plan: ?This is a 51 y.o. male with left lower quadrant pain. ? ?- Discussed with the patient that on exam and my evaluation of the CT scans, I do not see true evidence  of inguinal hernias on either side.  Location of his pain is truly in the left lower quadrant.  Discussed with him that if his pain did improve with antibiotic course, perhaps he did have an episode of very mild diverticulitis.  It is difficult to see truly if he has any large diverticular disease burden in his sigmoid colon but I do think that there was some mild wall thickening in the distal sigmoid.  Given this, it may have just been that he was undertreated for this episode.  As such, we will give him a trial of a 2-week course of Cipro and Flagyl to see if this improves  and resolves his pain.  Discussed with him that if there is no improvement or if there is any worsening pain, he should let us know so we can do a repeat CT scan.  This may need to be done if there is no changes

## 2021-07-08 NOTE — Patient Instructions (Signed)
We sent in two prescriptions for antibiotics into your pharmacy. Complete all of the medication. ?Recoger sus recetas de antibi?ticos. Termine todos los antibi?ticos que le hemos recetado. ? ?Let us know if your pain does not improve or if is worsens.  ?Ll?menos si su dolor no mejora o si comienza a empeorar. ? ?Follow up here in 3 weeks. ?Seguimiento aqu? en 3 semanas. ?

## 2021-07-31 ENCOUNTER — Ambulatory Visit: Payer: BC Managed Care – PPO | Admitting: Surgery

## 2021-08-13 ENCOUNTER — Emergency Department
Admission: EM | Admit: 2021-08-13 | Discharge: 2021-08-13 | Disposition: A | Discharge status: Home / Self Care | Payer: BC Managed Care – PPO | Attending: Emergency Medicine | Admitting: Emergency Medicine

## 2021-08-13 ENCOUNTER — Other Ambulatory Visit: Payer: Self-pay

## 2021-08-13 ENCOUNTER — Encounter: Payer: Self-pay | Admitting: Emergency Medicine

## 2021-08-13 DIAGNOSIS — K644 Residual hemorrhoidal skin tags: Secondary | ICD-10-CM | POA: Insufficient documentation

## 2021-08-13 DIAGNOSIS — K6289 Other specified diseases of anus and rectum: Secondary | ICD-10-CM | POA: Diagnosis present

## 2021-08-13 MED ORDER — HYDROCORTISONE ACETATE 25 MG RE SUPP: 25.0000 mg | Freq: Two times a day (BID) | RECTAL | 0 refills | Status: AC

## 2021-08-13 NOTE — ED Triage Notes (Signed)
Patient ambulatory to triage with steady gait, without difficulty or distress noted; pt reports boil to buttocks noted since Sunday; st hx of same but usually resolves on own

## 2021-08-13 NOTE — ED Notes (Signed)
Computer at bedside not working, pt verbalized understanding of discharge instructions and importance of calling the surgeon for follow up

## 2021-08-13 NOTE — Discharge Instructions (Addendum)
You can take Tylenol 1 g every 8 hours.  You can take the ibuprofen 600 every 8 hours or use advil as recommended on bottle.  Use the suppository.  Get the MiraLAX to help with soften your bowel movements and do not strain with having bowel movements.  Perform sitz bath's. Call the surgery number to make a follow-up appointment  Return to the ER for fevers, worsening bleeding or any other concerns

## 2021-08-13 NOTE — ED Provider Notes (Signed)
[facility] Provider Note    Event Date/Time   First MD Initiated Contact with Patient 08/13/21 [phone]     (approximate)   History   Abscess   HPI  Carl Mcfarland is a 51 y.o. male with plantar fasciitis who comes in with concerns for rectal pain.  Patient reports intermittent pain in his rectal area.  He reports that sometimes he will have a bulge there but then it would go away.  Today however he notes that the bulge has been there for the past 2 days and that he has some bleeding associated with it.  Denies ever having bleeding before.  Denies any pus drainage.  He denies any other fevers or other concerns.  Physical Exam   Triage Vital Signs: ED Triage Vitals [08/13/21 0648]  Enc Vitals Group     BP 113/70     Pulse Rate 76     Resp 18     Temp 97.8 F (36.6 C)     Temp Source Oral     SpO2 (!) 87 %     Weight 165 lb (74.8 kg)     Height 5\' 5"  (1.651 m)     Head Circumference      Peak Flow      Pain Score 8     Pain Loc      Pain Edu?      Excl. in GC?     Most recent vital signs: Vitals:   08/13/21 0648  BP: 113/70  Pulse: 76  Resp: 18  Temp: 97.8 F (36.6 C)  SpO2: (!) 87%     General: Awake, no distress.  CV:  Good peripheral perfusion.  Resp:  Normal effort.  Abd:  No distention.  Other:  Patient has external hemorrhoid noted with a little bit of skin breakdown on top of it leading to a little bit of bleeding.  It is not pulsatile in nature.  He is tender to palpation over this area.  There is no draining pus from it.  Does not appear thrombosed at this time   ED Results / Procedures / Treatments   Labs (all labs ordered are listed, but only abnormal results are displayed) Labs Reviewed - No data to display     PROCEDURES:  Critical Care performed: No  Procedures   MEDICATIONS ORDERED IN ED: Medications - No data to display   IMPRESSION / MDM / ASSESSMENT AND PLAN / ED COURSE  I reviewed the triage vital  signs and the nursing notes.  Patient comes in with concern for possible abscess versus hemorrhoid versus thrombosed hemorrhoid.  On examination I it is consistent with external hemorrhoid with a little ulceration on top of it but is causing a little bit of venous bleeding.  We discussed conservative management versus incision and will start with conservative treatment given it is not thrombosed.  Patient will be given surgery's number for follow-up.  We discussed Tylenol, ibuprofen, suppositories, sitz bath, no straining, MiraLAX and surgeries number for follow-up.  Patient expressed understanding felt comfortable with this plan  We discussed return to the ER if you develop significantly worsening bleeding or fevers or any other concerns.  To note-  oxygen level was charted at 87% in triage however patient is denying any shortness of breath and I replaced the O2 monitor and he is satting 99% and he denies any issues with shortness of breath  I reviewed patient's admission from 05/30/2021 where patient was  admitted for plantar fasciitis   FINAL CLINICAL IMPRESSION(S) / ED DIAGNOSES   Final diagnoses:  External hemorrhoid, bleeding     Rx / DC Orders   ED Discharge Orders          Ordered    hydrocortisone (ANUSOL-HC) 25 MG suppository  Every 12 hours        08/13/21 0804             Note:  This document was prepared using Dragon voice recognition software and may include unintentional dictation errors.   Concha Se, MD 08/13/21 [phone]

## 2021-08-13 NOTE — ED Notes (Signed)
Se triage note  presents with possible abscess area to buttocks/rectum  states he noticed the area on Sunday  afebrile on arrival

## 2021-08-14 ENCOUNTER — Ambulatory Visit: Payer: BC Managed Care – PPO | Admitting: Surgery

## 2021-08-14 ENCOUNTER — Other Ambulatory Visit: Payer: Self-pay

## 2021-08-14 ENCOUNTER — Encounter: Payer: Self-pay | Admitting: Surgery

## 2021-08-14 VITALS — BP 109/72 | HR 65 | Temp 98.4°F | Ht 65.0 in | Wt 159.6 lb

## 2021-08-14 DIAGNOSIS — K645 Perianal venous thrombosis: Secondary | ICD-10-CM | POA: Diagnosis not present

## 2021-08-14 NOTE — Patient Instructions (Addendum)
haga baos de agua tibia 2-3 veces al da. siempre haga esto despus de una evacuacin intestinal. tome ablandadores de heces para facilitar sus evacuaciones intestinales seguimiento aqu en 2 semanas.  Hemorroides Hemorrhoids Las hemorroides son venas inflamadas adentro o alrededor del recto o del ano. Hay dos tipos de hemorroides: Hemorroides internas. Se forman en las venas del interior del recto. Pueden abultarse hacia afuera, irritarse y doler. Hemorroides externas. Se producen en las venas externas del ano y pueden sentirse como un bulto o zona hinchada, dura y dolorosa cerca del ano. [place] hemorroides no causan problemas graves y se Engineer, petroleum con tratamientos caseros [employer] cambios en la dieta y el estilo de vida. Si los tratamientos caseros no ayudan con los sntomas, se pueden Optometrist procedimientos para reducir o extirpar las hemorroides. Cules son las causas? La causa de esta afeccin es el aumento de la presin en la zona anal. Esta presin puede ser causada por distintos factores, por ejemplo: Estreimiento. Hacer un gran esfuerzo para defecar. Diarrea. Embarazo. Obesidad. Estar sentado durante largos perodos de [place]. [place] objetos pesados u otras actividades que impliquen esfuerzo. Sexo anal. Andar en bicicleta por un largo perodo de tiempo. Cules son los signos o los sntomas? Los sntomas de esta afeccin incluyen: Social research officer, government. Picazn o irritacin anal. Sangrado rectal. Prdida de materia fecal (heces). Inflamacin anal. Uno o ms bultos alrededor del ano. Cmo se diagnostica? Esta afeccin se diagnostica frecuentemente a travs de un examen visual. Posiblemente le realicen otros tipos de pruebas o estudios, como los siguientes: Un examen que implica palpar el rea rectal con la mano enguantada (examen rectal digital). Un examen del canal anal que se realiza utilizando un pequeo tubo (anoscopio). Anlisis de sangre si ha perdido [place]  cantidad significativa de [place]. Una prueba que consiste en la observacin del interior del colon utilizando un tubo flexible con una cmara en el extremo (sigmoidoscopia o colonoscopa). Cmo se trata? Esta afeccin generalmente se puede tratar en el hogar. Sin embargo, se pueden TEFL teacher procedimientos si los cambios en la dieta, en el estilo de vida y otros tratamientos caseros no [employer] sntomas. Estos procedimientos pueden ayudar a reducir o [place] hemorroides completamente. Algunos de estos procedimientos son quirrgicos y otros no. Algunos de los procedimientos ms frecuentes son los siguientes: Ligadura con Forensic psychologist. Las bandas elsticas se colocan en la base de las hemorroides para interrumpir su irrigacin de Genisis Sonnier. Escleroterapia. Se inyecta un medicamento en las hemorroides para reducir su tamao. Coagulacin con luz infrarroja. Se utiliza un tipo de energa lumnica para eliminar las hemorroides. Hemorroidectoma. Las hemorroides se extirpan con [place] y las venas que las [place] se IT consultant. Hemorroidopexia con grapas. El cirujano engrapa la base de las hemorroides a la pared del recto. Siga estas instrucciones en su casa: Comida y bebida  Consuma alimentos con alto contenido de [place], como cereales integrales, porotos, frutos secos, frutas y verduras. Pregntele a su mdico acerca de tomar productos con fibra aadida en ellos (complementos de fibra). Disminuya la cantidad de grasa de la dieta. Esto se puede lograr consumiendo productos lcteos con bajo contenido de grasas, ingiriendo menor cantidad de carnes rojas y evitando los alimentos procesados. Beba suficiente lquido como para Theatre manager la orina de color amarillo plido. Control del dolor y la hinchazn  Tome baos de asiento tibios durante 20 minutos, 3 o 4 veces por da para Glass blower/designer y las [place]. Puede hacer esto en Isaiah Blakes o usar un dispositivo  porttil para bao de asiento que se  coloca sobre el inodoro. Si se lo indican, aplique hielo en la zona afectada. Usar compresas de [employer] baos de asiento puede ser Ai. Ponga el hielo en una bolsa plstica. Coloque una toalla entre la piel y Therapist, nutritional. Aplique el hielo durante 20 minutos, 2 o 3 veces por da. Instrucciones generales Use los medicamentos de venta libre y los recetados solamente como se lo haya indicado el mdico. Aplquese los medicamentos, cremas o supositorios como se lo hayan indicado. Haga ejercicio con regularidad. Consulte al mdico qu cantidad y qu tipo de ejercicio es mejor para usted. En general, debe realizar al menos 30 minutos de ejercicio moderado la [employer] de la semana (150 minutos cada semana). Esto puede incluir [employer], andar en bicicleta o practicar yoga. Vaya al bao cuando sienta la necesidad de defecar. No espere. Evite hacer fuerza en las deposiciones. Mantenga la zona anal limpia y seca. Use papel higinico hmedo o toallitas humedecidas despus de las deposiciones. No pase mucho tiempo sentado en el inodoro. Esto aumenta la afluencia de sangre y Conservation officer, historic buildings. Concurra a todas las visitas de seguimiento como se lo haya indicado el mdico. Esto es importante. Comunquese con un mdico si tiene: Aumento del dolor y la hinchazn que no puede controlar con medicamentos o Clinical research associate. No puede defecar o lo hace con dificultad. Dolor o tiene inflamacin fuera de la zona de las hemorroides. Solicite ayuda de inmediato si tiene: Hemorragia descontrolada en el recto. Resumen Las hemorroides son venas inflamadas adentro o alrededor del recto o del ano. La mayora de las hemorroides se pueden controlar con tratamientos caseros como cambios en la dieta y el estilo de vida. Tomar baos de asiento con agua tibia puede ayudar a Best boy y las [place]. En los casos graves, se pueden realizar procedimientos o Ardelia Mems ciruga para reducir o [place]  hemorroides. Esta informacin no tiene Marine scientist el consejo del mdico. Asegrese de hacerle al mdico cualquier pregunta que tenga. Document Revised: 10/02/2020 Document Reviewed: 10/02/2020 Elsevier Patient Education  [place] tomar un bao de asiento How to Take a [employer] Un bao de asiento es un bao de agua tibia que se puede usar para cuidar el recto, la zona genital o la zona entre el recto y los genitales (perineo). En un bao de asiento, el agua solamente llega [employer] International caderas y [place] las nalgas. Un bao de asiento puede hacerse en la baera o en una tina porttil para bao de asiento que se coloca sobre el inodoro. Su mdico puede recomendar un bao de asiento para ayudarlo con lo siguiente: Best boy y las molestias despus de dar a Actuary. Aliviar el dolor y la picazn causados por las hemorroides o las fisuras anales. Aliviar el dolor despus de determinadas cirugas. Relajar los msculos doloridos o tensos. Cmo tomar un bao de [employer] 3 o 4 baos de asiento diarios o tantos como se lo haya indicado el mdico. Bao de asiento en la baera Para tomar un bao de asiento en una baera: Llene parte de la baera con agua tibia. El agua debe tener la profundidad suficiente para cubrirle las caderas y las nalgas cuando est sentado en la baera. Siga las instrucciones de su mdico si le indica que ponga medicamentos en el agua. Sintese en el agua. Abra un poco el drenaje de la baera y djelo abierto durante su bao. Sherlon Handing  el agua tibia nuevamente, lo suficiente para reponer Youth worker. Deje correr el agua durante todo su bao. Esto ayuda a Engineer, manufacturing systems en el nivel adecuado y Therapist, sports. Sumrjase en el agua entre 15 y 32 minutos, o el tiempo que le haya indicado el mdico. Cuando termine, tenga cuidado al ponerse de pie. Puede sentirse mareado. Luego del bao de asiento, squese con golpecitos suaves. No frote la piel para  secarla.  Bao de asiento sobre el inodoro Para tomar un bao de asiento con un recipiente sobre el inodoro: Siga las instrucciones del fabricante. Llene el recipiente con agua tibia. Siga las instrucciones de su mdico si le indic que ponga medicamentos en el agua. Sintese en el asiento. Asegrese de que el agua le cubra las nalgas y el perineo. Sumrjase en el agua entre 15 y 88 minutos, o el tiempo que le haya indicado el mdico. Luego del bao de asiento, squese con golpecitos suaves. No frote la piel para secarla. Limpie y seque la tina despus de cada uso. Deseche el recipiente si se agrieta o segn las instrucciones del fabricante.  Comunquese con un mdico si: El dolor o la picazn [place]. No contine con los baos de asiento si los sntomas empeoran. Aparecen nuevos sntomas. No contine con los baos de asiento hasta que hable con el mdico. Resumen Un bao de asiento es un bao con agua tibia en el cual el agua solo le llega hasta la cadera y cubre las nalgas. Un bao de asiento puede [employer] y las molestias despus de dar a Actuary. Tambin puede ayudar con Conservation officer, historic buildings y la picazn de las hemorroides o fisuras anales, o con el dolor despus de ciertas cirugas. Tambin puede ayudar a [employer] doloridos o tensos. Tome 3 o 4 baos de asiento diarios o tantos como se lo haya indicado el mdico. Sumrjase en el agua entre 15 y 29 minutos. No contine con los baos de asiento si los sntomas empeoran. Esta informacin no tiene Marine scientist el consejo del mdico. Asegrese de hacerle al mdico cualquier pregunta que tenga. Document Revised: 12/29/2019 Document Reviewed: 12/02/2019 Elsevier Patient Education  [place].

## 2021-08-14 NOTE — Progress Notes (Signed)
Patient ID: UNNAMED ZEIEN, male   DOB: 02/15/1971, 51 y.o.   MRN: [id number]  HPI Carl Mcfarland is a 51 y.o. male seen in consultation at the request of Dr. Fuller Plan . He HAs 3 days hx of severe anorectal pain, sharp worsening w sitting. Yesterday had some drainage and hematochezia. Improved yesterday after he went to the ER.  Continues to have some drainage there is sanguinous. No prior colonoscopies.  Did have prior CT that have personally reviewed given some abdominal pain.  There is no evidence of acute intra-abdominal abnormalities.  No evidence of anorectal malignancies.  He is able to perform more than 6 METS of activity without any shortness of breath or chest pain.  His CBC and CMP were completely normal.  HPI  Past Medical History:  Diagnosis Date   GERD (gastroesophageal reflux disease)    OCC    History of kidney stones    Low testosterone    Sleep apnea    No CPAP   Vitamin B12 deficiency    Vitamin D deficiency disease     Past Surgical History:  Procedure Laterality Date   GANGLION CYST EXCISION Right 03/20/2016   Procedure: REMOVAL GANGLION OF WRIST;  Surgeon: Kennedy Bucker, MD;  Location: [facility];  Service: Orthopedics;  Laterality: Right;   HEEL SPUR RESECTION Left 05/30/2021   Procedure: HEEL SPUR RESECTION;  Surgeon: Rosetta Posner, DPM;  Location: [facility];  Service: Podiatry;  Laterality: Left;  sleep apnea   HERNIA REPAIR     umbilical   KNEE ARTHROSCOPY WITH ANTERIOR CRUCIATE LIGAMENT (ACL) REPAIR WITH HAMSTRING GRAFT Right 02/24/2020   Procedure: Right ACL reconstruction using quadriceps tendon autograft, medial and lateral meniscus repair vs partial meniscectomy and possible chondroplasty - Dedra Skeens to Assist;  Surgeon: Signa Kell, MD;  Location: [facility];  Service: Orthopedics;  Laterality: Right;  Dedra Skeens to Assist. AM start time.   KNEE SURGERY Right Dec. 2010   PLANTAR FASCIA RELEASE Left 05/30/2021   Procedure: PLANTAR FASCIA RELEASE;   Surgeon: Rosetta Posner, DPM;  Location: [facility];  Service: Podiatry;  Laterality: Left;  Anesthesia: Choice - preop pop/saph    Family History  Problem Relation Age of Onset   Diabetes Mother    Heart attack Maternal Grandfather    Dementia Paternal Grandfather     Social History Social History   Tobacco Use   Smoking status: Never   Smokeless tobacco: Never  Vaping Use   Vaping Use: Never used  Substance Use Topics   Alcohol use: No   Drug use: No    Comment: past use    No Known Allergies  Current Outpatient Medications  Medication Sig Dispense Refill   hydrocortisone (ANUSOL-HC) 25 MG suppository Place 1 suppository (25 mg total) rectally every 12 (twelve) hours for 6 days. 12 suppository 0   omeprazole (PRILOSEC) 20 MG capsule Take 20 mg by mouth daily.     No current facility-administered medications for this visit.     Review of Systems Full ROS  was asked and was negative except for the information on the HPI  Physical Exam Blood pressure 109/72, pulse 65, temperature 98.4 F (36.9 C), temperature source Oral, height 5\' 5"  (1.651 m), weight 159 lb 9.6 oz (72.4 kg), SpO2 98 %. CONSTITUTIONAL: NAD. EYES: Pupils are equal, round, and reactive to light, Sclera are non-icteric. EARS, NOSE, MOUTH AND THROAT: The oropharynx is clear. The oral mucosa is pink and moist. Hearing is intact  to voice. LYMPH NODES:  Lymph nodes in the neck are normal. RESPIRATORY:  Lungs are clear. There is normal respiratory effort, with equal breath sounds bilaterally, and without pathologic use of accessory muscles. CARDIOVASCULAR: Heart is regular without murmurs, gallops, or rubs. GI: The abdomen is  soft, nontender, and nondistended. There are no palpable masses. There is no hepatosplenomegaly. There are normal bowel sounds in all quadrants. Rectal: There is evidence of a thrombosed external hemorrhoid exquisitely tender to palpation it is located right lateral  position MUSCULOSKELETAL: Normal muscle strength and tone. No cyanosis or edema.   SKIN: Turgor is good and there are no pathologic skin lesions or ulcers. NEUROLOGIC: Motor and sensation is grossly normal. Cranial nerves are grossly intact. PSYCH:  Oriented to person, place and time. Affect is normal.  Data Reviewed  I have personally reviewed the patient's imaging, laboratory findings and medical records.    Assessment/Plan 51 year old male with acute thrombosed hemorrhoid.  I had a discussion with patient regarding options continuation of observation versus excision here in the office under local.  Patient feels that he is ready to have this excised.  Procedure discussed with the patient in detail.  Risk, benefits and possible applications including but not limited to: Bleeding, infection recurrence.  He understands and wished to proceed I spent 40 minutes in this encounter including coordination of her care, placing orders, personally reviewing imaging studies and performing appropriate documentation Copy of this report was sent to the referring provider    Procedure Note Excision of external hemorrhoid thrombosed  Findings: Both external hemorrhoid large, located right lateral position  [id number]  Anesthesia: lidocaine 1% w epi 8cc  After informed consent was obtained, the patient was placed in lateral decubitus position was prepped and draped in the usual sterile fashion and local anesthetic was infiltrated.  Using an 11 blade knife excision of the hemorrhoid was performed to drain the clot.  Hemostasis was obtained with pressure.  No complications  Sterling Big, MD FACS General Surgeon 08/14/2021, 1:14 PM

## 2021-08-21 ENCOUNTER — Encounter: Payer: Self-pay | Admitting: Surgery

## 2021-08-21 ENCOUNTER — Ambulatory Visit (INDEPENDENT_AMBULATORY_CARE_PROVIDER_SITE_OTHER): Payer: BC Managed Care – PPO | Admitting: Surgery

## 2021-08-21 ENCOUNTER — Other Ambulatory Visit: Payer: Self-pay

## 2021-08-21 VITALS — BP 117/74 | HR 64 | Temp 98.4°F | Ht 65.0 in | Wt 161.2 lb

## 2021-08-21 DIAGNOSIS — K645 Perianal venous thrombosis: Secondary | ICD-10-CM

## 2021-08-21 NOTE — Patient Instructions (Addendum)
Please see your follow up appointment listed below.   Hemorroides Hemorrhoids Las hemorroides son venas inflamadas adentro o alrededor del recto o del ano. Hay dos tipos de hemorroides: Hemorroides internas. Se forman en las venas del interior del recto. Pueden abultarse hacia afuera, irritarse y doler. Hemorroides externas. Se producen en las venas externas del ano y pueden sentirse como un bulto o zona hinchada, dura y dolorosa cerca del ano. La mayora de las hemorroides no causan problemas graves y se Sports coach con tratamientos caseros [employer] cambios en la dieta y el estilo de vida. Si los tratamientos caseros no ayudan con los sntomas, se pueden Education officer, environmental procedimientos para reducir o extirpar las hemorroides. Cules son las causas? La causa de esta afeccin es el aumento de la presin en la zona anal. Esta presin puede ser causada por distintos factores, por ejemplo: Estreimiento. Hacer un gran esfuerzo para defecar. Diarrea. Embarazo. Obesidad. Estar sentado durante largos perodos de [place]. Levantar objetos pesados u otras actividades que impliquen esfuerzo. Sexo anal. Andar en bicicleta por un largo perodo de tiempo. Cules son los signos o los sntomas? Los sntomas de esta afeccin incluyen: Engineer, mining. Picazn o irritacin anal. Sangrado rectal. Prdida de materia fecal (heces). Inflamacin anal. Uno o ms bultos alrededor del ano. Cmo se diagnostica? Esta afeccin se diagnostica frecuentemente a travs de un examen visual. Posiblemente le realicen otros tipos de pruebas o estudios, como los siguientes: Un examen que implica palpar el rea rectal con la mano enguantada (examen rectal digital). Un examen del canal anal que se realiza utilizando un pequeo tubo (anoscopio). Anlisis de sangre si ha perdido [place] cantidad significativa de [place]. Una prueba que consiste en la observacin del interior del colon utilizando un tubo flexible con una cmara en el  extremo (sigmoidoscopia o colonoscopa). Cmo se trata? Esta afeccin generalmente se puede tratar en el hogar. Sin embargo, se pueden Wellsite geologist procedimientos si los cambios en la dieta, en el estilo de vida y otros tratamientos caseros no [employer] sntomas. Estos procedimientos pueden ayudar a reducir o extirpar las hemorroides completamente. Algunos de estos procedimientos son quirrgicos y otros no. Algunos de los procedimientos ms frecuentes son los siguientes: Ligadura con Curator. Las bandas elsticas se colocan en la base de las hemorroides para interrumpir su irrigacin de [place]. Escleroterapia. Se inyecta un medicamento en las hemorroides para reducir su tamao. Coagulacin con luz infrarroja. Se utiliza un tipo de energa lumnica para eliminar las hemorroides. Hemorroidectoma. Las hemorroides se extirpan con [place] y las venas que las [place] se Web designer. Hemorroidopexia con grapas. El cirujano engrapa la base de las hemorroides a la pared del recto. Siga estas instrucciones en su casa: Comida y bebida  Consuma alimentos con alto contenido de [place], como cereales integrales, porotos, frutos secos, frutas y verduras. Pregntele a su mdico acerca de tomar productos con fibra aadida en ellos (complementos de fibra). Disminuya la cantidad de grasa de la dieta. Esto se puede lograr consumiendo productos lcteos con bajo contenido de grasas, ingiriendo menor cantidad de carnes rojas y evitando los alimentos procesados. Beba suficiente lquido como para Pharmacologist la orina de color amarillo plido. Control del dolor y la hinchazn  Tome baos de asiento tibios durante 20 minutos, 3 o 4 veces por da para Primary school teacher y las [place]. Puede hacer esto en una baera o usar un dispositivo porttil para bao de asiento que se coloca sobre el inodoro. Si se lo indican, aplique hielo en la zona afectada. Usar  compresas de [employer] baos de asiento puede ser  [place]. Ponga el hielo en una bolsa plstica. Coloque una toalla entre la piel y Copy. Aplique el hielo durante 20 minutos, 2 o 3 veces por da. Instrucciones generales Use los medicamentos de venta libre y los recetados solamente como se lo haya indicado el mdico. Aplquese los medicamentos, cremas o supositorios como se lo hayan indicado. Haga ejercicio con regularidad. Consulte al mdico qu cantidad y qu tipo de ejercicio es mejor para usted. En general, debe realizar al menos 30 minutos de ejercicio moderado la [employer] de la semana (150 minutos cada semana). Esto puede incluir [employer], andar en bicicleta o practicar yoga. Vaya al bao cuando sienta la necesidad de defecar. No espere. Evite hacer fuerza en las deposiciones. Mantenga la zona anal limpia y seca. Use papel higinico hmedo o toallitas humedecidas despus de las deposiciones. No pase mucho tiempo sentado en el inodoro. Esto aumenta la afluencia de sangre y Chief Technology Officer. Concurra a todas las visitas de [address] se lo haya indicado el mdico. Esto es importante. Comunquese con un mdico si tiene: Aumento del dolor y la hinchazn que no puede controlar con medicamentos o TEFL teacher. No puede defecar o lo hace con dificultad. Dolor o tiene inflamacin fuera de la zona de las hemorroides. Solicite ayuda de inmediato si tiene: Hemorragia descontrolada en el recto. Resumen Las hemorroides son venas inflamadas adentro o alrededor del recto o del ano. La mayora de las hemorroides se pueden controlar con tratamientos caseros como cambios en la dieta y el estilo de vida. Tomar baos de asiento con agua tibia puede ayudar a Engineer, materials y las [place]. En los casos graves, se pueden realizar procedimientos o Neomia Dear ciruga para reducir o extirpar las hemorroides. Esta informacin no tiene Theme park manager el consejo del mdico. Asegrese de hacerle al mdico cualquier pregunta que  tenga. Document Revised: 10/02/2020 Document Reviewed: 10/02/2020 Elsevier Patient Education  2023 [employer]. [employer]

## 2021-08-23 NOTE — Progress Notes (Signed)
Carl Mcfarland is well known to me last week had Thrombosed external hemorrhoid excised. HE did well but a couple of day ago had again similar severe pain anorectal area. No fevers or chills.    PE: NAD, non  toxic Rectal: prior hemorrhoidectomy site clean there is another thrombosed hemorrhoid Right anterolateral position exquisitely tender to palpation   PROCEDURE NOTE 1/ I/D thrombosed ext hemorrhoid  Anesthesia: lidocaine 1% w epi 8cc  Complications: none  After informed consent was obtained the pt was placed lateral decubitus position. Area prepped and draped in the standard fashion. Lidocaine was infiltrated and using 11 blade incision was created to evacuate the clot. Hemostasis obtained w pressure. SIgnficant relief after procedure.

## 2021-08-26 ENCOUNTER — Encounter: Payer: BC Managed Care – PPO | Admitting: Surgery

## 2021-08-28 ENCOUNTER — Encounter: Payer: Self-pay | Admitting: Surgery

## 2021-08-28 ENCOUNTER — Ambulatory Visit (INDEPENDENT_AMBULATORY_CARE_PROVIDER_SITE_OTHER): Payer: BC Managed Care – PPO | Admitting: Surgery

## 2021-08-28 VITALS — BP 105/68 | HR 60 | Temp 98.8°F | Wt 164.8 lb

## 2021-08-28 DIAGNOSIS — Z09 Encounter for follow-up examination after completed treatment for conditions other than malignant neoplasm: Secondary | ICD-10-CM

## 2021-08-28 DIAGNOSIS — K645 Perianal venous thrombosis: Secondary | ICD-10-CM

## 2021-08-28 NOTE — Patient Instructions (Signed)
If you have any concerns or questions, please feel free to call our office. Follow up as needed.   Hemorroides Hemorrhoids Las hemorroides son venas inflamadas que pueden desarrollarse: En el ano (recto). Estas se denominan hemorroides internas. Alrededor de la abertura del ano. Estas se denominan hemorroides externas. Las hemorroides pueden causar dolor, picazn o hemorragias. Generalmente no causan problemas graves. Con frecuencia mejoran al [employer] dieta, el estilo de vida y otros tratamientos Facilities manager. Cules son las causas? Esta afeccin puede ser causada por lo siguiente: Tener dificultad para defecar (estreimiento). Hacer mucha fuerza (esfuerzo) para defecar. Materia fecal lquida (diarrea). Embarazo. Tener mucho sobrepeso (obesidad). Estar sentado durante largos perodos de [place]. Levantar objetos pesados u otras actividades que impliquen esfuerzo. Sexo anal. Andar en bicicleta por un largo perodo de tiempo. Cules son los signos o los sntomas? Los sntomas de esta afeccin incluyen: Engineer, mining. Picazn o irritacin en el ano. Sangrado proveniente del ano. Prdida de materia fecal. Inflamacin en la zona. Uno o ms bultos alrededor de la abertura del ano. Cmo se diagnostica? A menudo un mdico puede diagnosticar esta afeccin al observar la zona afectada. El mdico tambin puede: Education officer, environmental un examen que implica palpar la zona con la mano enguantada (examen rectal digital). Examinar el interior de la zona anal utilizando un pequeo tubo (anoscopio). Pedir anlisis de [place]. Es posible que esto se realice si ha perdido Warden/ranger. Solicitarle un estudio que consiste en la observacin del interior del colon utilizando un tubo flexible con una cmara en el extremo (sigmoidoscopia o colonoscopa). Cmo se trata? Esta afeccin generalmente se puede tratar en el hogar. El mdico puede indicarle que cambie de Paediatric nurse, de estilo de vida o que trate de IT trainer. Si esto no da resultado, se pueden realizar procedimientos para extirpar las hemorroides o reducir Brewing technologist. Estos pueden implicar lo siguiente: Museum/gallery conservator en la base de las hemorroides para interrumpir la irrigacin de Risk manager. Inyectar un medicamento en las hemorroides para reducir Brewing technologist. Dirigir un tipo de Engineer, drilling de luz hacia las hemorroides para Radio producer que se caigan. Realizar Neomia Dear ciruga para extirpar las hemorroides o cortar la irrigacin de [place]. Siga estas instrucciones en su casa: Comida y bebida  Consuma alimentos con alto contenido de [place]. Entre ellos cereales integrales, frijoles, frutos secos, frutas y verduras. Pregntele a su mdico acerca de tomar productos con fibra aadida en ellos (complementos de fibra). Disminuya la cantidad de grasa de la dieta. Para esto, puede hacer lo siguiente: Coma productos lcteos descremados. Coma menos carne roja. No consuma alimentos procesados. Beba suficiente lquido para Photographer orina de color amarillo plido. Control del dolor y la hinchazn  Tome un bao de agua tibia (bao de asiento) durante 20 minutos para Engineer, materials. Hgalo 3 o 4 veces al da. Puede hacer esto en una baera o usar un dispositivo porttil para bao de asiento que se coloca sobre el inodoro. Si se lo indican, aplique hielo sobre la zona dolorida. Puede ser beneficioso aplicar hielo [employer] baos con agua tibia. Ponga el hielo en una bolsa plstica. Coloque una toalla entre la piel y Copy. Aplique el hielo durante 20 minutos, 2 o 3 veces por da. Instrucciones generales Use los medicamentos de venta libre y los recetados solamente como se lo haya indicado el mdico. Las cremas medicinales y los medicamentos pueden usarse como se lo hayan indicado. Haga ejercicio fsico con frecuencia. Consulte al mdico qu tipos  de ejercicios son mejores para usted y qu cantidad. Vaya al bao cuando sienta ganas de  defecar. No espere. Evite hacer demasiada fuerza al defecar. Mantenga el ano seco y limpio. Use papel higinico hmedo o toallitas humedecidas despus de defecar. No pase mucho tiempo sentado en el inodoro. Concurra a todas las visitas de [address] se lo haya indicado el mdico. Esto es importante. Comunquese con un mdico si: Tiene dolor e hinchazn que no mejoran con el tratamiento o los medicamentos. Tiene problemas para defecar. No puede defecar. Tiene dolor o hinchazn en la zona exterior de las hemorroides. Solicite ayuda de inmediato si tiene: Hemorragia que no se detiene. Resumen Las hemorroides son venas hinchadas en el ano o la zona que rodea el ano. Pueden causar dolor, picazn o sangrado. Consuma alimentos con alto contenido de [place]. Entre ellos cereales integrales, frijoles, frutos secos, frutas y verduras. Tome un bao de agua tibia (bao de asiento) durante 20 minutos para Engineer, materials. Hgalo 3 o 4 veces al da. Esta informacin no tiene Theme park manager el consejo del mdico. Asegrese de hacerle al mdico cualquier pregunta que tenga. Document Revised: 10/02/2020 Document Reviewed: 10/02/2020 Elsevier Patient Education  2023 [employer].

## 2021-08-30 ENCOUNTER — Encounter: Payer: Self-pay | Admitting: Surgery

## 2021-08-30 ENCOUNTER — Ambulatory Visit (INDEPENDENT_AMBULATORY_CARE_PROVIDER_SITE_OTHER): Payer: BC Managed Care – PPO | Admitting: Surgery

## 2021-08-30 VITALS — BP 99/63 | HR 53 | Temp 98.4°F | Wt 163.6 lb

## 2021-08-30 DIAGNOSIS — R1032 Left lower quadrant pain: Secondary | ICD-10-CM

## 2021-08-30 NOTE — Progress Notes (Signed)
Patient seen on 6/21 by Dr. Everlene Farrier.  Denies any pain issues.    No charge for this visit.  Henrene Dodge, MD

## 2022-01-09 ENCOUNTER — Telehealth: Payer: Self-pay | Admitting: Internal Medicine

## 2022-01-09 NOTE — Telephone Encounter (Signed)
Pt wife called and stated pt has pain in the middle of his stomach and a strange odor coming from his breath. Wife mentioned it's been going on for about 6 weeks, and the sharp pain comes and goes.  Wife mentioned pt was told last year it could be a hernia. Pt wife declined to speak with NT or receive a call back from a nurse to speak with pt about current symptoms. Requested pt be scheduled for a physical. Pt scheduled for 01/13/2022.     FYI for the office.

## 2022-01-10 NOTE — Telephone Encounter (Signed)
If scheduled for CPE he will not be able to talk about an acute problem he will need to schedule separate visit. Please call to schedule

## 2022-01-10 NOTE — Telephone Encounter (Signed)
Lvm to inform pt

## 2022-01-13 ENCOUNTER — Encounter: Payer: BC Managed Care – PPO | Admitting: Internal Medicine

## 2022-01-20 ENCOUNTER — Emergency Department: Payer: BC Managed Care – PPO

## 2022-01-20 ENCOUNTER — Emergency Department
Admission: EM | Admit: 2022-01-20 | Discharge: 2022-01-20 | Disposition: A | Discharge status: Home / Self Care | Payer: BC Managed Care – PPO | Attending: Emergency Medicine | Admitting: Emergency Medicine

## 2022-01-20 ENCOUNTER — Encounter: Payer: Self-pay | Admitting: Emergency Medicine

## 2022-01-20 ENCOUNTER — Other Ambulatory Visit: Payer: Self-pay

## 2022-01-20 DIAGNOSIS — S81812A Laceration without foreign body, left lower leg, initial encounter: Secondary | ICD-10-CM | POA: Insufficient documentation

## 2022-01-20 DIAGNOSIS — Z23 Encounter for immunization: Secondary | ICD-10-CM | POA: Diagnosis not present

## 2022-01-20 DIAGNOSIS — Y990 Civilian activity done for income or pay: Secondary | ICD-10-CM | POA: Diagnosis not present

## 2022-01-20 DIAGNOSIS — S8992XA Unspecified injury of left lower leg, initial encounter: Secondary | ICD-10-CM | POA: Diagnosis present

## 2022-01-20 DIAGNOSIS — W312XXA Contact with powered woodworking and forming machines, initial encounter: Secondary | ICD-10-CM | POA: Diagnosis not present

## 2022-01-20 MED ORDER — CEPHALEXIN 500 MG PO CAPS: 500.0000 mg | ORAL_CAPSULE | Freq: Three times a day (TID) | ORAL | 0 refills | Status: AC

## 2022-01-20 MED ORDER — OXYCODONE-ACETAMINOPHEN 5-325 MG PO TABS: 1.0000 | ORAL_TABLET | ORAL | 0 refills | Status: DC | PRN

## 2022-01-20 MED ORDER — TETANUS-DIPHTH-ACELL PERTUSSIS 5-2.5-18.5 LF-MCG/0.5 IM SUSY
0.5000 mL | PREFILLED_SYRINGE | Freq: Once | INTRAMUSCULAR | Status: AC
  Administered 2022-01-20: 0.5 mL via INTRAMUSCULAR
  Filled 2022-01-20: qty 0.5

## 2022-01-20 MED ORDER — LIDOCAINE HCL (PF) 1 % IJ SOLN
10.0000 mL | Freq: Once | INTRAMUSCULAR | Status: AC
  Administered 2022-01-20: 10 mL
  Filled 2022-01-20: qty 10

## 2022-01-20 NOTE — ED Provider Triage Note (Signed)
Emergency Medicine Provider Triage Evaluation Note  Carl Mcfarland , a 51 y.o. male  was evaluated in triage.  Pt complains of laceration to left lower leg, dropped a saw on his leg at work.  Review of Systems  Positive:  Negative:   Physical Exam  BP 100/81 (BP Location: Left Arm)   Pulse 62   Temp 98.2 F (36.8 C) (Oral)   Resp 18   Wt 73.5 kg   SpO2 99%   BMI 26.96 kg/m  Gen:   Awake, no distress   Resp:  Normal effort  MSK:   Moves extremities without difficulty, deep laceration, bleeding through dressing Other:    Medical Decision Making  Medically screening exam initiated at 2:09 PM.  Appropriate orders placed.  Carl Mcfarland was informed that the remainder of the evaluation will be completed by another provider, this initial triage assessment does not replace that evaluation, and the importance of remaining in the ED until their evaluation is complete.     Faythe Ghee, PA-C 01/20/22 1410

## 2022-01-20 NOTE — ED Triage Notes (Addendum)
Pt arrived via POV. Pt dropped a saw on his left lower leg and have a laceration. Pt has placed pressure dressing on the leg at this time blood is controlled. Second laceration found the on the lateral side of pt left ankle. Pressure dressing applied. Bleeding controlled at this time.

## 2022-01-20 NOTE — ED Provider Notes (Cosign Needed Addendum)
[facility] Provider Note    Event Date/Time   First MD Initiated Contact with Patient 01/20/22 1527     (approximate)   History   Laceration   HPI  Carl Mcfarland is a 51 y.o. male with no significant past medical history presents emergency department with complaints of a laceration to the left lower extremity.  Patient states he was at work and dropped a blade saw on his leg.  Unsure of his last Tdap.      Physical Exam   Triage Vital Signs: ED Triage Vitals  Enc Vitals Group     BP 01/20/22 1406 100/81     Pulse Rate 01/20/22 1406 62     Resp 01/20/22 1406 18     Temp 01/20/22 1406 98.2 F (36.8 C)     Temp Source 01/20/22 1406 Oral     SpO2 01/20/22 1406 99 %     Weight 01/20/22 1407 162 lb (73.5 kg)     Height 01/20/22 1533 5\' 5"  (1.651 m)     Head Circumference --      Peak Flow --      Pain Score 01/20/22 1407 9     Pain Loc --      Pain Edu? --      Excl. in GC? --     Most recent vital signs: Vitals:   01/20/22 1406  BP: 100/81  Pulse: 62  Resp: 18  Temp: 98.2 F (36.8 C)  SpO2: 99%     General: Awake, no distress.   CV:  Good peripheral perfusion. regular rate and  rhythm Resp:  Normal effort.  Abd:  No distention.   Other:  Left lower extremity with 3 separate lacerations, no foreign body noted, neurovascular is intact, patient is able to bear weight   ED Results / Procedures / Treatments   Labs (all labs ordered are listed, but only abnormal results are displayed) Labs Reviewed - No data to display   EKG     RADIOLOGY X-ray of the left tib-fib    PROCEDURES:   .11/15/23Laceration Repair  Date/Time: 01/20/2022 4:35 PM  Performed by: 01/22/2022, PA-C Authorized by: Faythe Ghee, PA-C   Consent:    Consent obtained:  Verbal   Consent given by:  Patient   Risks, benefits, and alternatives were discussed: yes     Risks discussed:  Infection, pain, retained foreign body, tendon damage, poor  cosmetic result, need for additional repair, nerve damage, poor wound healing and vascular damage   Alternatives discussed:  Delayed treatment Universal protocol:    Procedure explained and questions answered to patient or proxy's satisfaction: yes     Immediately prior to procedure, a time out was called: yes     Patient identity confirmed:  Verbally with patient Anesthesia:    Anesthesia method:  Local infiltration   Local anesthetic:  Lidocaine 1% w/o epi Laceration details:    Location:  Leg   Leg location:  L lower leg   Length (cm):  2 Pre-procedure details:    Preparation:  Patient was prepped and draped in usual sterile fashion and imaging obtained to evaluate for foreign bodies Exploration:    Hemostasis achieved with:  Direct pressure   Imaging obtained: x-ray     Imaging outcome: foreign body not noted     Wound exploration: entire depth of wound visualized     Wound extent: areolar tissue not violated, fascia not violated, no foreign  body, no signs of injury, no nerve damage, no tendon damage, no underlying fracture and no vascular damage     Contaminated: yes   Treatment:    Area cleansed with:  Povidone-iodine and saline   Amount of cleaning:  Extensive   Irrigation solution:  Sterile saline   Irrigation method:  Pressure wash, syringe and tap Skin repair:    Repair method:  Sutures   Suture size:  4-0   Suture material:  Nylon   Suture technique:  Simple interrupted   Number of sutures:  3 Approximation:    Approximation:  Close Repair type:    Repair type:  Simple Post-procedure details:    Dressing:  Non-adherent dressing   Procedure completion:  Tolerated well, no immediate complications .[name] KitchenLaceration Repair  Date/Time: 01/20/2022 4:36 PM  Performed by: Faythe Ghee, PA-C Authorized by: Faythe Ghee, PA-C   Consent:    Consent obtained:  Verbal   Consent given by:  Patient   Risks, benefits, and alternatives were discussed: yes     Risks  discussed:  Infection, pain, tendon damage, need for additional repair, retained foreign body, poor cosmetic result, vascular damage, poor wound healing and nerve damage   Alternatives discussed:  No treatment Universal protocol:    Procedure explained and questions answered to patient or proxy's satisfaction: yes     Immediately prior to procedure, a time out was called: yes     Patient identity confirmed:  Verbally with patient Anesthesia:    Anesthesia method:  Local infiltration   Local anesthetic:  Lidocaine 1% w/o epi Laceration details:    Location:  Leg   Leg location:  L lower leg   Length (cm):  2 Pre-procedure details:    Preparation:  Patient was prepped and draped in usual sterile fashion and imaging obtained to evaluate for foreign bodies Exploration:    Hemostasis achieved with:  Direct pressure   Imaging obtained: x-ray     Imaging outcome: foreign body not noted     Wound exploration: entire depth of wound visualized     Wound extent: areolar tissue not violated, fascia not violated, no foreign body, no signs of injury, no nerve damage, no tendon damage, no underlying fracture and no vascular damage   Treatment:    Area cleansed with:  Povidone-iodine and saline   Amount of cleaning:  Standard   Irrigation solution:  Sterile saline   Irrigation method:  Syringe and tap Skin repair:    Repair method:  Sutures   Suture size:  4-0   Suture material:  Nylon   Suture technique:  Simple interrupted   Number of sutures:  3 Approximation:    Approximation:  Close Repair type:    Repair type:  Simple Post-procedure details:    Dressing:  Non-adherent dressing   Procedure completion:  Tolerated well, no immediate complications .[name] KitchenLaceration Repair  Date/Time: 01/20/2022 4:37 PM  Performed by: Faythe Ghee, PA-C Authorized by: Faythe Ghee, PA-C   Consent:    Consent obtained:  Verbal   Consent given by:  Patient   Risks, benefits, and alternatives were  discussed: yes     Risks discussed:  Infection, pain, retained foreign body, tendon damage, poor cosmetic result, need for additional repair, nerve damage, poor wound healing and vascular damage   Alternatives discussed:  Delayed treatment Universal protocol:    Procedure explained and questions answered to patient or proxy's satisfaction: yes     Immediately prior to procedure,  a time out was called: yes     Patient identity confirmed:  Verbally with patient Anesthesia:    Anesthesia method:  Local infiltration   Local anesthetic:  Lidocaine 1% w/o epi Laceration details:    Location:  Leg   Leg location:  L lower leg   Length (cm):  5 Pre-procedure details:    Preparation:  Patient was prepped and draped in usual sterile fashion and imaging obtained to evaluate for foreign bodies Exploration:    Hemostasis achieved with:  Direct pressure   Imaging obtained: x-ray     Imaging outcome: foreign body not noted     Wound exploration: entire depth of wound visualized     Wound extent: areolar tissue not violated, fascia not violated, no foreign body, no signs of injury, no nerve damage, no tendon damage, no underlying fracture and no vascular damage     Contaminated: no   Treatment:    Area cleansed with:  Povidone-iodine and saline   Amount of cleaning:  Standard   Irrigation solution:  Sterile saline   Irrigation method:  Syringe and tap Skin repair:    Repair method:  Sutures   Suture size:  4-0   Suture material:  Nylon   Suture technique:  Simple interrupted   Number of sutures:  8 Approximation:    Approximation:  Close Repair type:    Repair type:  Simple Post-procedure details:    Dressing:  Non-adherent dressing   Procedure completion:  Tolerated well, no immediate complications    MEDICATIONS ORDERED IN ED: Medications  Tdap (BOOSTRIX) injection 0.5 mL (0.5 mLs Intramuscular Given 01/20/22 1533)  lidocaine (PF) (XYLOCAINE) 1 % injection 10 mL (10 mLs Infiltration  Given 01/20/22 1548)     IMPRESSION / MDM / ASSESSMENT AND PLAN / ED COURSE  I reviewed the triage vital signs and the nursing notes.                              Differential diagnosis includes, but is not limited to, laceration, fracture, contusion  Patient's presentation is most consistent with acute complicated illness / injury requiring diagnostic workup.   X-ray of the left tib-fib does not show any acute fracture, independently reviewed and interpreted by me.  Radiologist was concerned of an area that could be at the developmental or a cortical fracture, the patient is not tender at this area so do not feel that he has a fracture  Procedure note for laceration repairs Tdap was updated here in the ED  Patient was given strict instructions to follow-up with his regular doctor, return emergency department or urgent care for suture removal in 10 to 14 days.  We discussed signs and symptoms of infection.  He is to wash the area only with soap and water.  Return emergency department as needed, work restrictions were given to the patient.  I do feel that he needs to be out of work for a few days to elevate the leg to enhance healing.  He is in agreement treatment plan.  Discharged stable condition.      FINAL CLINICAL IMPRESSION(S) / ED DIAGNOSES   Final diagnoses:  Laceration of left lower leg, initial encounter     Rx / DC Orders   ED Discharge Orders          Ordered    cephALEXin (KEFLEX) 500 MG capsule  3 times daily  01/20/22 1630    oxyCODONE-acetaminophen (PERCOCET) 5-325 MG tablet  Every 4 hours PRN        01/20/22 1630             Note:  This document was prepared using Dragon voice recognition software and may include unintentional dictation errors.    Faythe Ghee, PA-C 01/20/22 1639    Faythe Ghee, PA-C 01/20/22 1643    Georga Hacking, MD 01/21/22 [phone]

## 2022-01-20 NOTE — Discharge Instructions (Signed)
Follow-up with your regular doctor in 10 to 14 days for suture removal.  Keep the leg elevated is much as possible.  Take the antibiotic as prescribed.  Return emergency department for any sign of infection.

## 2022-01-24 ENCOUNTER — Ambulatory Visit: Payer: BC Managed Care – PPO | Admitting: Internal Medicine

## 2022-02-03 ENCOUNTER — Encounter: Payer: Self-pay | Admitting: Internal Medicine

## 2022-02-03 ENCOUNTER — Ambulatory Visit: Payer: BC Managed Care – PPO | Admitting: Internal Medicine

## 2022-02-03 VITALS — BP 110/64 | HR 70 | Temp 98.6°F | Resp 16 | Ht 63.0 in | Wt 162.9 lb

## 2022-02-03 DIAGNOSIS — Z4802 Encounter for removal of sutures: Secondary | ICD-10-CM | POA: Diagnosis not present

## 2022-02-03 DIAGNOSIS — Z23 Encounter for immunization: Secondary | ICD-10-CM | POA: Diagnosis not present

## 2022-02-03 NOTE — Progress Notes (Signed)
   Acute Office Visit  Subjective:     Patient ID: Carl Mcfarland, male    DOB: 1970/06/18, 51 y.o.   MRN: [id number]  Chief Complaint  Patient presents with   Suture / Staple Removal    HPI Patient is in today for suture removal.  Patient was seen in the ER on 01/20/2022 after sustaining a laceration to the left lower extremity.  Patient states he was at work and dropped a blade saw on his leg.  X-ray of the left tibia/fibula inconclusive.  Patient received an updated Tdap immunization. He ended up with 3 lacerations, two with 3 sutures and one with 8 sutures. Pain is well controlled, no signs of infection or issues.   Review of Systems  Constitutional:  Negative for chills and fever.  Skin: Negative.        Objective:    BP 110/64   Pulse 70   Temp 98.6 F (37 C)   Resp 16   Ht 5\' 3"  (1.6 m)   Wt 162 lb 14.4 oz (73.9 kg)   SpO2 98%   BMI 28.86 kg/m    Physical Exam Constitutional:      Appearance: Normal appearance.  HENT:     Head: Normocephalic and atraumatic.  Eyes:     Conjunctiva/sclera: Conjunctivae normal.  Skin:    General: Skin is warm and dry.     Comments: 3 large lacerations in left lower leg  Neurological:     General: No focal deficit present.     Mental Status: He is alert. Mental status is at baseline.  Psychiatric:        Mood and Affect: Mood normal.        Behavior: Behavior normal.     No results found for any visits on 02/03/22.      Assessment & Plan:   1. Visit for suture removal: Sutures removed, lacerations approximated well, largest one nearly approximated, laceration cleaned and covered. Patient will keep it covered and not soak until it has completely closed and will call with any concerns of increased pain/signs of infection.  2. Need for influenza vaccination: Flu vaccine due.   - Flu Vaccine QUAD 6+ mos PF IM (Fluarix Quad PF)  Return for CPE .  02/05/22, DO

## 2022-02-12 ENCOUNTER — Encounter: Payer: BC Managed Care – PPO | Admitting: Internal Medicine

## 2022-02-27 ENCOUNTER — Ambulatory Visit (INDEPENDENT_AMBULATORY_CARE_PROVIDER_SITE_OTHER): Payer: BC Managed Care – PPO | Admitting: Internal Medicine

## 2022-02-27 ENCOUNTER — Encounter: Payer: Self-pay | Admitting: Internal Medicine

## 2022-02-27 VITALS — BP 112/64 | HR 87 | Temp 98.9°F | Resp 16 | Ht 63.0 in | Wt 165.4 lb

## 2022-02-27 DIAGNOSIS — Z0000 Encounter for general adult medical examination without abnormal findings: Secondary | ICD-10-CM | POA: Diagnosis not present

## 2022-02-27 DIAGNOSIS — Z125 Encounter for screening for malignant neoplasm of prostate: Secondary | ICD-10-CM | POA: Diagnosis not present

## 2022-02-27 DIAGNOSIS — E782 Mixed hyperlipidemia: Secondary | ICD-10-CM

## 2022-02-27 NOTE — Progress Notes (Signed)
Name: Carl Mcfarland   MRN: [id number]    DOB: [birth year]   Date:02/27/2022       Progress Note  Subjective  Chief Complaint  Chief Complaint  Patient presents with   Annual Exam    HPI  Patient presents for annual CPE.  IPSS Questionnaire (AUA-7): Over the past month.   1)  How often have you had a sensation of not emptying your bladder completely after you finish urinating?  1 - Less than 1 time in 5  2)  How often have you had to urinate again less than two hours after you finished urinating? 0 - Not at all  3)  How often have you found you stopped and started again several times when you urinated?  0 - Not at all  4) How difficult have you found it to postpone urination?  0 - Not at all  5) How often have you had a weak urinary stream?  0 - Not at all  6) How often have you had to push or strain to begin urination?  0 - Not at all  7) How many times did you most typically get up to urinate from the time you went to bed until the time you got up in the morning?  0 - None  Total score:  0-7 mildly symptomatic   8-19 moderately symptomatic   20-35 severely symptomatic     Diet: overall well rounded, cooks at home Exercise: runs regularly  Last Dental Exam: every year  Last Eye Exam: every year   Depression: phq 9 is negative    02/27/2022    2:44 PM 02/03/2022    9:04 AM 05/29/2021    8:42 AM 05/09/2021    9:34 AM 05/03/2021    2:16 PM  Depression screen PHQ 2/9  Decreased Interest 0 0 0 0 0  Down, Depressed, Hopeless 0 0 0 0 0  PHQ - 2 Score 0 0 0 0 0  Altered sleeping 0 0  0 0  Tired, decreased energy 0 0  0 0  Change in appetite 0 0  0 0  Feeling bad or failure about yourself  0 0  0 0  Trouble concentrating 0 0  0 0  Moving slowly or fidgety/restless 0 0  0 0  Suicidal thoughts 0 0  0 0  PHQ-9 Score 0 0  0 0  Difficult doing work/chores Not difficult at all Not difficult at all  Not difficult at all Not difficult at all    Hypertension:  BP Readings from  Last 3 Encounters:  02/27/22 112/64  02/03/22 110/64  01/20/22 109/80    Obesity: Wt Readings from Last 3 Encounters:  02/27/22 165 lb 6.4 oz (75 kg)  02/03/22 162 lb 14.4 oz (73.9 kg)  01/20/22 163 lb 2.3 oz (74 kg)   BMI Readings from Last 3 Encounters:  02/27/22 29.30 kg/m  02/03/22 28.86 kg/m  01/20/22 27.15 kg/m     Lipids:  Lab Results  Component Value Date   CHOL 179 08/27/2020   CHOL 253 (H) 07/13/2019   CHOL 199 08/24/2017   Lab Results  Component Value Date   HDL 47 08/27/2020   HDL 57 07/13/2019   HDL 52 08/24/2017   Lab Results  Component Value Date   LDLCALC 106 (H) 08/27/2020   LDLCALC 168 (H) 07/13/2019   LDLCALC 126 (H) 08/24/2017   Lab Results  Component Value Date   TRIG 144 08/27/2020   TRIG  141 07/13/2019   TRIG 104 08/24/2017   Lab Results  Component Value Date   CHOLHDL 3.8 08/27/2020   CHOLHDL 4.4 07/13/2019   CHOLHDL 3.8 08/24/2017   No results found for: "LDLDIRECT" Glucose:  Glucose  Date Value Ref Range Status  03/20/2011 136 (H) 65 - 99 mg/dL Final   Glucose, Bld  Date Value Ref Range Status  05/03/2021 87 65 - 99 mg/dL Final    Comment:    .            Fasting reference interval .   08/27/2020 82 65 - 99 mg/dL Final    Comment:    .            Fasting reference interval .   07/13/2019 93 65 - 99 mg/dL Final    Comment:    .            Fasting reference interval .     Flowsheet Row Office Visit from 05/29/2021 in [facility]  AUDIT-C Score 0       Married STD testing and prevention (HIV/chl/gon/syphilis):  no concerns Hep C Screening: 2021 Skin cancer: Discussed monitoring for atypical lesions Colorectal cancer: Cologuard 3/23, negative Prostate cancer:  yes Lab Results  Component Value Date   PSA 0.34 08/27/2020   PSA 0.5 07/13/2019   Lung cancer:  Low Dose CT Chest recommended if Age 75-80 years, 30 pack-year currently smoking OR have quit w/in 15years. Patient  no a  candidate for screening   AAA: The USPSTF recommends one-time screening with ultrasonography in men ages 6 to 75 years who have ever smoked. Patient   no, a candidate for screening    Vaccines:   HPV: N/A Tdap: 11/23 Shingrix: Qualifies - discussed  Pneumonia: Due at age 66 Flu: Up-to-date COVID-19: 3 total vaccines  Advanced Care Planning: A voluntary discussion about advance care planning including the explanation and discussion of advance directives.  Discussed health care proxy and Living will, and the patient was able to identify a health care proxy as wife Carl Mcfarland.  Patient does have a living will and power of attorney of health care.  Patient Active Problem List   Diagnosis Date Noted   Mixed hyperlipidemia 07/14/2019   Overweight (BMI 25.0-29.9) 07/13/2019   Colon cancer screening 08/20/2016   Preventative health care 05/05/2015   Nail abnormality 04/26/2015   Tinea pedis 04/26/2015   Sleep apnea    GERD (gastroesophageal reflux disease)    Calculus of kidney    Vitamin D deficiency disease    Vitamin B12 deficiency    Low testosterone     Past Surgical History:  Procedure Laterality Date   GANGLION CYST EXCISION Right 03/20/2016   Procedure: REMOVAL GANGLION OF WRIST;  Surgeon: Kennedy Bucker, MD;  Location: [facility];  Service: Orthopedics;  Laterality: Right;   HEEL SPUR RESECTION Left 05/30/2021   Procedure: HEEL SPUR RESECTION;  Surgeon: Rosetta Posner, DPM;  Location: [facility];  Service: Podiatry;  Laterality: Left;  sleep apnea   HERNIA REPAIR     umbilical   KNEE ARTHROSCOPY WITH ANTERIOR CRUCIATE LIGAMENT (ACL) REPAIR WITH HAMSTRING GRAFT Right 02/24/2020   Procedure: Right ACL reconstruction using quadriceps tendon autograft, medial and lateral meniscus repair vs partial meniscectomy and possible chondroplasty - Dedra Skeens to Assist;  Surgeon: Signa Kell, MD;  Location: [facility];  Service: Orthopedics;  Laterality: Right;  Dedra Skeens to Assist.  AM start time.   KNEE SURGERY  Right Dec. 2010   PLANTAR FASCIA RELEASE Left 05/30/2021   Procedure: PLANTAR FASCIA RELEASE;  Surgeon: Rosetta PosnerBaker, Andrew, DPM;  [facility];  Service: Podiatry;  Laterality: Left;  Anesthesia: Choice - preop pop/saph    Family History  Problem Relation Age of Onset   Diabetes Mother    Heart attack Maternal Grandfather    Dementia Paternal Grandfather     Social History   Socioeconomic History   Marital status: Married    Spouse name: Not on file   Number of children: Not on file   Years of education: Not on file   Highest education level: Not on file  Occupational History   Not on file  Tobacco Use   Smoking status: Never   Smokeless tobacco: Never  Vaping Use   Vaping Use: Never used  Substance and Sexual Activity   Alcohol use: No   Drug use: No    Comment: past use   Sexual activity: Not Currently  Other Topics Concern   Not on file  Social History Narrative   Not on file   Social Determinants of Health   Financial Resource Strain: Low Risk  (02/27/2022)   Overall Financial Resource Strain (CARDIA)    Difficulty of Paying Living Expenses: Not hard at all  Food Insecurity: No Food Insecurity (02/27/2022)   Hunger Vital Sign    Worried About Running Out of Food in the Last Year: Never true    Ran Out of Food in the Last Year: Never true  Transportation Needs: No Transportation Needs (02/27/2022)   PRAPARE - Administrator, Civil ServiceTransportation    Lack of Transportation (Medical): No    Lack of Transportation (Non-Medical): No  Physical Activity: Inactive (08/27/2020)   Exercise Vital Sign    Days of Exercise per Week: 0 days    Minutes of Exercise per Session: 0 min  Stress: No Stress Concern Present (02/27/2022)   [employer] - Occupational Stress Questionnaire    Feeling of Stress : Not at all  Social Connections: Moderately Integrated (02/27/2022)   Social Connection and Isolation Panel [NHANES]     Frequency of Communication with Friends and Family: More than three times a week    Frequency of Social Gatherings with Friends and Family: More than three times a week    Attends Religious Services: Never    Database administratorActive Member of Clubs or Organizations: Yes    Attends Engineer, structuralClub or Organization Meetings: More than 4 times per year    Marital Status: Married  Catering managerntimate Partner Violence: Not At Risk (02/27/2022)   Humiliation, Afraid, Rape, and Kick questionnaire    Fear of Current or Ex-Partner: No    Emotionally Abused: No    Physically Abused: No    Sexually Abused: No     Current Outpatient Medications:    omeprazole (PRILOSEC) 20 MG capsule, Take 20 mg by mouth daily., Disp: , Rfl:   No Known Allergies   Review of Systems  All other systems reviewed and are negative.      Objective  Vitals:   02/27/22 1439  BP: 112/64  Pulse: 87  Resp: 16  Temp: 98.9 F (37.2 C)  SpO2: 97%  Weight: 165 lb 6.4 oz (75 kg)  Height: 5\' 3"  (1.6 m)    Body mass index is 29.3 kg/m.  Physical Exam Constitutional:      Appearance: Normal appearance.  HENT:     Head: Normocephalic and atraumatic.  Eyes:     Conjunctiva/sclera:  Conjunctivae normal.  Cardiovascular:     Rate and Rhythm: Normal rate and regular rhythm.  Pulmonary:     Effort: Pulmonary effort is normal.     Breath sounds: Normal breath sounds.  Musculoskeletal:     Cervical back: No tenderness.     Right lower leg: No edema.     Left lower leg: No edema.  Lymphadenopathy:     Cervical: No cervical adenopathy.  Skin:    General: Skin is warm and dry.  Neurological:     General: No focal deficit present.     Mental Status: He is alert. Mental status is at baseline.  Psychiatric:        Mood and Affect: Mood normal.        Behavior: Behavior normal.     No results found for this or any previous visit (from the past 2160 hour(s)).   Fall Risk:    02/27/2022    2:39 PM 02/03/2022    9:04 AM 08/21/2021    2:49 PM  08/14/2021    8:52 AM 05/29/2021    8:41 AM  Fall Risk   Falls in the past year? 0 0 0 0 0  Number falls in past yr: 0 0   0  Injury with Fall? 0 0   0  Follow up     Falls evaluation completed     Functional Status Survey: Is the patient deaf or have difficulty hearing?: No Does the patient have difficulty seeing, even when wearing glasses/contacts?: No Does the patient have difficulty concentrating, remembering, or making decisions?: No Does the patient have difficulty walking or climbing stairs?: No Does the patient have difficulty dressing or bathing?: No Does the patient have difficulty doing errands alone such as visiting a doctor's office or shopping?: No    Assessment & Plan  1. Annual physical exam/Mixed hyperlipidemia/Prostate cancer screening: Annual labs ordered.   - CBC w/Diff/Platelet - COMPLETE METABOLIC PANEL WITH GFR - Lipid Profile - PSA   -Prostate cancer screening and PSA options (with potential risks and benefits of testing vs not testing) were discussed along with recent recs/guidelines. -USPSTF grade A and B recommendations reviewed with patient; age-appropriate recommendations, preventive care, screening tests, etc discussed and encouraged; healthy living encouraged; see AVS for patient education given to patient -Discussed importance of 150 minutes of physical activity weekly, eat two servings of fish weekly, eat one serving of tree nuts ( cashews, pistachios, pecans, almonds.[name] Kitchen) every other day, eat 6 servings of fruit/vegetables daily and drink plenty of water and avoid sweet beverages.  -Reviewed Health Maintenance: yes

## 2022-03-01 LAB — LIPID PANEL
Cholesterol: 204 mg/dL — ABNORMAL HIGH (ref <200)
HDL: 60 mg/dL (ref >=40)
LDL Cholesterol (Calc): 117 mg/dL (calc) — ABNORMAL HIGH
Non-HDL Cholesterol (Calc): 144 mg/dL (calc) — ABNORMAL HIGH (ref <130)
Total CHOL/HDL Ratio: 3.4 (calc) (ref <5.0)
Triglycerides: 153 mg/dL — ABNORMAL HIGH (ref <150)

## 2022-03-01 LAB — CBC WITH DIFFERENTIAL/PLATELET
Absolute Monocytes: 543 cells/uL (ref 200–950)
Basophils Absolute: 31 cells/uL (ref 0–200)
Basophils Relative: 0.5 %
Eosinophils Absolute: 79 cells/uL (ref 15–500)
Eosinophils Relative: 1.3 %
HCT: 46.6 % (ref 38.5–50.0)
Hemoglobin: 15.8 g/dL (ref 13.2–17.1)
Lymphs Abs: 2355 cells/uL (ref 850–3900)
MCH: 31.8 pg (ref 27.0–33.0)
MCHC: 33.9 g/dL (ref 32.0–36.0)
MCV: 93.8 fL (ref 80.0–100.0)
MPV: 9.9 fL (ref 7.5–12.5)
Monocytes Relative: 8.9 %
Neutro Abs: 3093 cells/uL (ref 1500–7800)
Neutrophils Relative %: 50.7 %
Platelets: 227 10*3/uL (ref 140–400)
RBC: 4.97 10*6/uL (ref 4.20–5.80)
RDW: 12.7 % (ref 11.0–15.0)
Total Lymphocyte: 38.6 %
WBC: 6.1 10*3/uL (ref 3.8–10.8)

## 2022-03-01 LAB — COMPLETE METABOLIC PANEL WITH GFR
AG Ratio: 1.5 (calc) (ref 1.0–2.5)
ALT: 18 U/L (ref 9–46)
AST: 16 U/L (ref 10–35)
Albumin: 4.1 g/dL (ref 3.6–5.1)
Alkaline phosphatase (APISO): 77 U/L (ref 35–144)
BUN: 21 mg/dL (ref 7–25)
CO2: 28 mmol/L (ref 20–32)
Calcium: 9 mg/dL (ref 8.6–10.3)
Chloride: 102 mmol/L (ref 98–110)
Creat: 1.02 mg/dL (ref 0.70–1.30)
Globulin: 2.7 g/dL (calc) (ref 1.9–3.7)
Glucose, Bld: 84 mg/dL (ref 65–99)
Potassium: 4.9 mmol/L (ref 3.5–5.3)
Sodium: 136 mmol/L (ref 135–146)
Total Bilirubin: 0.4 mg/dL (ref 0.2–1.2)
Total Protein: 6.8 g/dL (ref 6.1–8.1)
eGFR: 88 mL/min/{1.73_m2} (ref >=60)

## 2022-03-01 LAB — PSA: PSA: 0.44 ng/mL (ref <=4.00)

## 2023-03-09 ENCOUNTER — Encounter: Payer: BC Managed Care – PPO | Admitting: Internal Medicine

## 2023-03-25 NOTE — Progress Notes (Signed)
Name: Carl Mcfarland   MRN: [id number]    DOB: 09/21/1970   Date:03/26/2023       Progress Note  Subjective  Chief Complaint  Chief Complaint  Patient presents with   Annual Exam    HPI  Patient presents for annual CPE. Patient has noticed a red itchy rash in his groin for the last few months. He feels like it is slowly improving on its own but sometimes will feel like its burning. He is not using anything on it, just trying to keep it clean and dry.   He has also been having mild LLQ pain on and off for a few weeks. It is slowly improving and is on and off, nothing in particular triggers the pain. He does have a history of diverticulitis, last CT scan in 2023. Denies nausea, vomiting, fevers, changes in bowel movements.   GERD: -Symptoms stable, currently taking Prilosec 20 mg OTC   Diet: Regular Exercise: 4 days 60 minutes Last Dental Exam: 2023 Last Eye Exam: scheduled  Depression: phq 9 is negative    03/26/2023    9:14 AM 02/27/2022    2:44 PM 02/03/2022    9:04 AM 05/29/2021    8:42 AM 05/09/2021    9:34 AM  Depression screen PHQ 2/9  Decreased Interest 0 0 0 0 0  Down, Depressed, Hopeless 0 0 0 0 0  PHQ - 2 Score 0 0 0 0 0  Altered sleeping  0 0  0  Tired, decreased energy  0 0  0  Change in appetite  0 0  0  Feeling bad or failure about yourself   0 0  0  Trouble concentrating  0 0  0  Moving slowly or fidgety/restless  0 0  0  Suicidal thoughts  0 0  0  PHQ-9 Score  0 0  0  Difficult doing work/chores  Not difficult at all Not difficult at all  Not difficult at all    Hypertension:  BP Readings from Last 3 Encounters:  03/26/23 124/78  02/27/22 112/64  02/03/22 110/64    Obesity: Wt Readings from Last 3 Encounters:  03/26/23 166 lb (75.3 kg)  02/27/22 165 lb 6.4 oz (75 kg)  02/03/22 162 lb 14.4 oz (73.9 kg)   BMI Readings from Last 3 Encounters:  03/26/23 29.41 kg/m  02/27/22 29.30 kg/m  02/03/22 28.86 kg/m     Flowsheet Row Office Visit from  03/26/2023 in [facility]  AUDIT-C Score 0       Married STD testing and prevention (HIV/chl/gon/syphilis):  no Hep C Screening: completed Skin cancer: Discussed monitoring for atypical lesions Colorectal cancer: 05/27/2021 Cologuard negative Prostate cancer:  yes 02/28/2022 Lab Results  Component Value Date   PSA 0.44 02/28/2022   PSA 0.34 08/27/2020   PSA 0.5 07/13/2019    Lung cancer:  Low Dose CT Chest recommended if Age 54-80 years, 30 pack-year currently smoking OR have quit w/in 15years. Patient  is not a candidate for screening   AAA: The USPSTF recommends one-time screening with ultrasonography in men ages 54 to 75 years who have ever smoked. Patient   is not a candidate for screening   Vaccines: flu vaccine due today, discussed getting shingles vaccine at the pharmacy   Advanced Care Planning: A voluntary discussion about advance care planning including the explanation and discussion of advance directives.  Discussed health care proxy and Living will, and the patient was able to identify a  health care proxy as Devlin Alkire (wife).  Patient does not have a living will and power of attorney of health care   Patient Active Problem List   Diagnosis Date Noted   Mixed hyperlipidemia 07/14/2019   Overweight (BMI 25.0-29.9) 07/13/2019   Colon cancer screening 08/20/2016   Preventative health care 05/05/2015   Nail abnormality 04/26/2015   Tinea pedis 04/26/2015   Sleep apnea    GERD (gastroesophageal reflux disease)    Calculus of kidney    Vitamin D deficiency disease    Vitamin B12 deficiency    Low testosterone     Past Surgical History:  Procedure Laterality Date   GANGLION CYST EXCISION Right 03/20/2016   Procedure: REMOVAL GANGLION OF WRIST;  Surgeon: Kennedy Bucker, MD;  Location: [facility];  Service: Orthopedics;  Laterality: Right;   HEEL SPUR RESECTION Left 05/30/2021   Procedure: HEEL SPUR RESECTION;  Surgeon: Rosetta Posner, DPM;   Location: [facility];  Service: Podiatry;  Laterality: Left;  sleep apnea   HERNIA REPAIR     umbilical   KNEE ARTHROSCOPY WITH ANTERIOR CRUCIATE LIGAMENT (ACL) REPAIR WITH HAMSTRING GRAFT Right 02/24/2020   Procedure: Right ACL reconstruction using quadriceps tendon autograft, medial and lateral meniscus repair vs partial meniscectomy and possible chondroplasty - Dedra Skeens to Assist;  Surgeon: Signa Kell, MD;  Location: [facility];  Service: Orthopedics;  Laterality: Right;  Dedra Skeens to Assist. AM start time.   KNEE SURGERY Right Dec. 2010   PLANTAR FASCIA RELEASE Left 05/30/2021   Procedure: PLANTAR FASCIA RELEASE;  Surgeon: Rosetta Posner, DPM;  Location: [facility];  Service: Podiatry;  Laterality: Left;  Anesthesia: Choice - preop pop/saph    Family History  Problem Relation Age of Onset   Diabetes Mother    Heart attack Maternal Grandfather    Dementia Paternal Grandfather     Social History   Socioeconomic History   Marital status: Married    Spouse name: Not on file   Number of children: Not on file   Years of education: Not on file   Highest education level: Not on file  Occupational History   Not on file  Tobacco Use   Smoking status: Never   Smokeless tobacco: Never  Vaping Use   Vaping status: Never Used  Substance and Sexual Activity   Alcohol use: No   Drug use: No    Comment: past use   Sexual activity: Not Currently  Other Topics Concern   Not on file  Social History Narrative   Not on file   Social Drivers of Health   Financial Resource Strain: Low Risk  (03/26/2023)   Overall Financial Resource Strain (CARDIA)    Difficulty of Paying Living Expenses: Not hard at all  Food Insecurity: No Food Insecurity (03/26/2023)   Hunger Vital Sign    Worried About Running Out of Food in the Last Year: Never true    Ran Out of Food in the Last Year: Never true  Transportation Needs: No Transportation Needs (03/26/2023)   PRAPARE - Therapist, art (Medical): No    Lack of Transportation (Non-Medical): No  Physical Activity: Sufficiently Active (03/26/2023)   Exercise Vital Sign    Days of Exercise per Week: 4 days    Minutes of Exercise per Session: 60 min  Stress: No Stress Concern Present (03/26/2023)   [employer] of Occupational Health - Occupational Stress Questionnaire    Feeling of Stress : Not at  all  Social Connections: Moderately Integrated (03/26/2023)   Social Connection and Isolation Panel [NHANES]    Frequency of Communication with Friends and Family: More than three times a week    Frequency of Social Gatherings with Friends and Family: More than three times a week    Attends Religious Services: Never    Database administrator or Organizations: Yes    Attends Engineer, structural: More than 4 times per year    Marital Status: Married  Catering manager Violence: Not At Risk (03/26/2023)   Humiliation, Afraid, Rape, and Kick questionnaire    Fear of Current or Ex-Partner: No    Emotionally Abused: No    Physically Abused: No    Sexually Abused: No     Current Outpatient Medications:    omeprazole (PRILOSEC) 20 MG capsule, Take 20 mg by mouth daily., Disp: , Rfl:   No Known Allergies   Review of Systems  Constitutional:  Negative for chills and fever.  Eyes:  Negative for blurred vision.  Respiratory:  Negative for shortness of breath.   Cardiovascular:  Negative for chest pain.  Gastrointestinal:  Positive for abdominal pain. Negative for constipation, diarrhea, heartburn, nausea and vomiting.  Skin:  Positive for itching and rash.      Objective  Vitals:   03/26/23 0913  BP: 124/78  Pulse: 66  Resp: 16  Temp: 98.2 F (36.8 C)  TempSrc: Oral  SpO2: 97%  Weight: 166 lb (75.3 kg)  Height: 5\' 3"  (1.6 m)    Body mass index is 29.41 kg/m.  Physical Exam Constitutional:      Appearance: Normal appearance.  HENT:     Head: Normocephalic and atraumatic.      Mouth/Throat:     Mouth: Mucous membranes are moist.     Pharynx: Oropharynx is clear.  Eyes:     Extraocular Movements: Extraocular movements intact.     Conjunctiva/sclera: Conjunctivae normal.     Pupils: Pupils are equal, round, and reactive to light.  Neck:     Comments: No thyromegaly  Cardiovascular:     Rate and Rhythm: Normal rate and regular rhythm.  Pulmonary:     Effort: Pulmonary effort is normal.     Breath sounds: Normal breath sounds.  Abdominal:     General: Bowel sounds are normal. There is no distension.     Palpations: Abdomen is soft. There is no mass.     Tenderness: There is no abdominal tenderness. There is no guarding or rebound.     Hernia: No hernia is present.  Musculoskeletal:     Cervical back: No tenderness.  Lymphadenopathy:     Cervical: No cervical adenopathy.  Skin:    General: Skin is warm and dry.     Findings: Rash present.     Comments: Erythematous macular dry appearing rash in the upper thighs/groin bilaterally   Neurological:     General: No focal deficit present.     Mental Status: He is alert. Mental status is at baseline.  Psychiatric:        Mood and Affect: Mood normal.        Behavior: Behavior normal.     Last CBC Lab Results  Component Value Date   WBC 6.1 02/28/2022   HGB 15.8 02/28/2022   HCT 46.6 02/28/2022   MCV 93.8 02/28/2022   MCH 31.8 02/28/2022   RDW 12.7 02/28/2022   PLT 227 02/28/2022   Last metabolic panel Lab Results  Component  Value Date   GLUCOSE 84 02/28/2022   NA 136 02/28/2022   K 4.9 02/28/2022   CL 102 02/28/2022   CO2 28 02/28/2022   BUN 21 02/28/2022   CREATININE 1.02 02/28/2022   EGFR 88 02/28/2022   CALCIUM 9.0 02/28/2022   PROT 6.8 02/28/2022   ALBUMIN 4.1 08/20/2016   LABGLOB 2.7 04/26/2015   AGRATIO 1.6 04/26/2015   BILITOT 0.4 02/28/2022   ALKPHOS 67 08/20/2016   AST 16 02/28/2022   ALT 18 02/28/2022   ANIONGAP 8 04/06/2019   Last lipids Lab Results  Component  Value Date   CHOL 204 (H) 02/28/2022   HDL 60 02/28/2022   LDLCALC 117 (H) 02/28/2022   TRIG 153 (H) 02/28/2022   CHOLHDL 3.4 02/28/2022   Last hemoglobin A1c No results found for: "HGBA1C" Last thyroid functions Lab Results  Component Value Date   TSH 1.37 07/13/2019   Last vitamin D Lab Results  Component Value Date   VD25OH 25 (L) 08/20/2016   Last vitamin B12 and Folate Lab Results  Component Value Date   VITAMINB12 346 08/20/2016      Assessment & Plan  1. Annual physical exam (Primary)/Mixed hyperlipidemia/Prostate cancer screening: Physical exam completed, health maintenance reviewed and annual labs ordered.   - CBC w/Diff/Platelet - COMPLETE METABOLIC PANEL WITH GFR - Lipid Profile - PSA  2. Yeast dermatitis: Treat with medicated powder, continue to keep area dry and clean and wear breathable underwear.   - nystatin (MYCOSTATIN/NYSTOP) powder; Apply 1 Application topically 3 (three) times daily.  Dispense: 15 g; Refill: 0  3. Left lower quadrant abdominal pain: Abdominal exam benign, he does have a history of recurrent diverticulitis. For now recommend bland diet but if pain becomes more consistent or severe return for follow up appointment.   4. Gastroesophageal reflux disease, unspecified whether esophagitis present: Stable, refill PPI.   - omeprazole (PRILOSEC) 20 MG capsule; Take 1 capsule (20 mg total) by mouth daily.  Dispense: 90 capsule; Refill: 3  5. Need for influenza vaccination: Flu vaccine administered today.   - Flu vaccine trivalent PF, 6mos and older(Flulaval,Afluria,Fluarix,Fluzone)   -Prostate cancer screening and PSA options (with potential risks and benefits of testing vs not testing) were discussed along with recent recs/guidelines. -USPSTF grade A and B recommendations reviewed with patient; age-appropriate recommendations, preventive care, screening tests, etc discussed and encouraged; healthy living encouraged; see AVS for patient  education given to patient -Discussed importance of 150 minutes of physical activity weekly, eat two servings of fish weekly, eat one serving of tree nuts ( cashews, pistachios, pecans, almonds.[name] Kitchen) every other day, eat 6 servings of fruit/vegetables daily and drink plenty of water and avoid sweet beverages.  -Reviewed Health Maintenance: yes

## 2023-03-26 ENCOUNTER — Encounter: Payer: Self-pay | Admitting: Internal Medicine

## 2023-03-26 ENCOUNTER — Other Ambulatory Visit: Payer: Self-pay

## 2023-03-26 ENCOUNTER — Ambulatory Visit (INDEPENDENT_AMBULATORY_CARE_PROVIDER_SITE_OTHER): Payer: 59 | Admitting: Internal Medicine

## 2023-03-26 VITALS — BP 124/78 | HR 66 | Temp 98.2°F | Resp 16 | Ht 63.0 in | Wt 166.0 lb

## 2023-03-26 DIAGNOSIS — E782 Mixed hyperlipidemia: Secondary | ICD-10-CM | POA: Diagnosis not present

## 2023-03-26 DIAGNOSIS — Z125 Encounter for screening for malignant neoplasm of prostate: Secondary | ICD-10-CM

## 2023-03-26 DIAGNOSIS — Z23 Encounter for immunization: Secondary | ICD-10-CM | POA: Diagnosis not present

## 2023-03-26 DIAGNOSIS — R1032 Left lower quadrant pain: Secondary | ICD-10-CM

## 2023-03-26 DIAGNOSIS — B372 Candidiasis of skin and nail: Secondary | ICD-10-CM | POA: Diagnosis not present

## 2023-03-26 DIAGNOSIS — Z0001 Encounter for general adult medical examination with abnormal findings: Secondary | ICD-10-CM

## 2023-03-26 DIAGNOSIS — Z0000 Encounter for general adult medical examination without abnormal findings: Secondary | ICD-10-CM

## 2023-03-26 DIAGNOSIS — K219 Gastro-esophageal reflux disease without esophagitis: Secondary | ICD-10-CM

## 2023-03-26 MED ORDER — NYSTATIN 100000 UNIT/GM EX POWD: 1.0000 | Freq: Three times a day (TID) | CUTANEOUS | 0 refills | Status: AC

## 2023-03-26 MED ORDER — OMEPRAZOLE 20 MG PO CPDR: 20.0000 mg | DELAYED_RELEASE_CAPSULE | Freq: Every day | ORAL | 3 refills | Status: DC

## 2023-03-27 ENCOUNTER — Other Ambulatory Visit: Payer: Self-pay | Admitting: Internal Medicine

## 2023-03-27 DIAGNOSIS — E782 Mixed hyperlipidemia: Secondary | ICD-10-CM

## 2023-03-27 LAB — CBC WITH DIFFERENTIAL/PLATELET
Absolute Lymphocytes: 1727 {cells}/uL (ref 850–3900)
Absolute Monocytes: 564 {cells}/uL (ref 200–950)
Basophils Absolute: 29 {cells}/uL (ref 0–200)
Basophils Relative: 0.5 %
Eosinophils Absolute: 63 {cells}/uL (ref 15–500)
Eosinophils Relative: 1.1 %
HCT: 44 % (ref 38.5–50.0)
Hemoglobin: 14.9 g/dL (ref 13.2–17.1)
MCH: 31.4 pg (ref 27.0–33.0)
MCHC: 33.9 g/dL (ref 32.0–36.0)
MCV: 92.8 fL (ref 80.0–100.0)
MPV: 10.6 fL (ref 7.5–12.5)
Monocytes Relative: 9.9 %
Neutro Abs: 3317 {cells}/uL (ref 1500–7800)
Neutrophils Relative %: 58.2 %
Platelets: 242 10*3/uL (ref 140–400)
RBC: 4.74 10*6/uL (ref 4.20–5.80)
RDW: 12.5 % (ref 11.0–15.0)
Total Lymphocyte: 30.3 %
WBC: 5.7 10*3/uL (ref 3.8–10.8)

## 2023-03-27 LAB — COMPLETE METABOLIC PANEL WITH GFR
AG Ratio: 1.5 (calc) (ref 1.0–2.5)
ALT: 24 U/L (ref 9–46)
AST: 20 U/L (ref 10–35)
Albumin: 4.3 g/dL (ref 3.6–5.1)
Alkaline phosphatase (APISO): 73 U/L (ref 35–144)
BUN: 20 mg/dL (ref 7–25)
CO2: 27 mmol/L (ref 20–32)
Calcium: 9.1 mg/dL (ref 8.6–10.3)
Chloride: 103 mmol/L (ref 98–110)
Creat: 0.92 mg/dL (ref 0.70–1.30)
Globulin: 2.8 g/dL (ref 1.9–3.7)
Glucose, Bld: 80 mg/dL (ref 65–99)
Potassium: 4.2 mmol/L (ref 3.5–5.3)
Sodium: 138 mmol/L (ref 135–146)
Total Bilirubin: 0.5 mg/dL (ref 0.2–1.2)
Total Protein: 7.1 g/dL (ref 6.1–8.1)
eGFR: 99 mL/min/{1.73_m2} (ref >=60)

## 2023-03-27 LAB — LIPID PANEL
Cholesterol: 212 mg/dL — ABNORMAL HIGH (ref <200)
HDL: 51 mg/dL (ref >=40)
LDL Cholesterol (Calc): 135 mg/dL — ABNORMAL HIGH
Non-HDL Cholesterol (Calc): 161 mg/dL — ABNORMAL HIGH (ref <130)
Total CHOL/HDL Ratio: 4.2 (calc) (ref <5.0)
Triglycerides: 131 mg/dL (ref <150)

## 2023-03-27 LAB — PSA: PSA: 0.45 ng/mL (ref <=4.00)

## 2023-03-27 MED ORDER — ROSUVASTATIN CALCIUM 10 MG PO TABS: 10.0000 mg | ORAL_TABLET | Freq: Every day | ORAL | 1 refills | Status: AC

## 2024-01-11 IMAGING — CT CT ABD-PELV W/O CM
2 of 4 series · 16 of 46 positions shown, 18 images · non-contrast
Comparison: CT AP, 06/09/2013 and 03/20/2011

CLINICAL DATA: LLQ pain x 1 month. Past hx hernia repair.



[Series 2: routine abd/pel wo · axial · 0.77mm/px · z∈[-919,-464]mm · 13 of 99 slices shown, 15 images]
[im 4/99  soft-tissue]
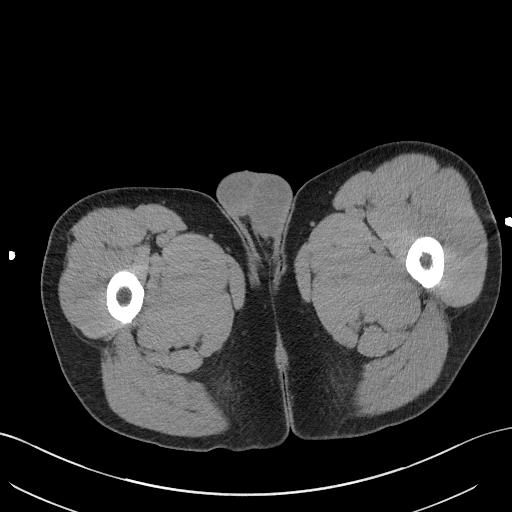
[im 4/99  bone]
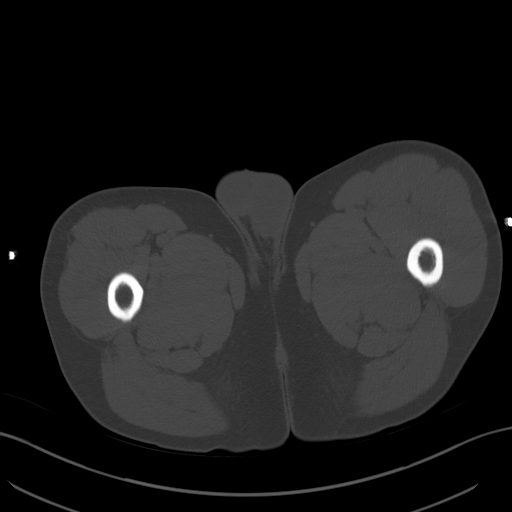
[im 12/99  soft-tissue]
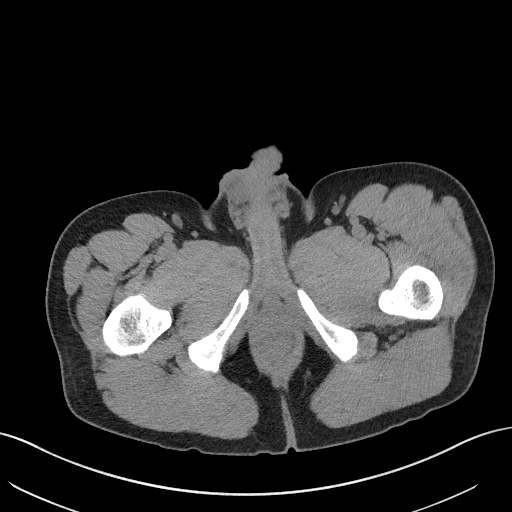
[im 20/99  soft-tissue]
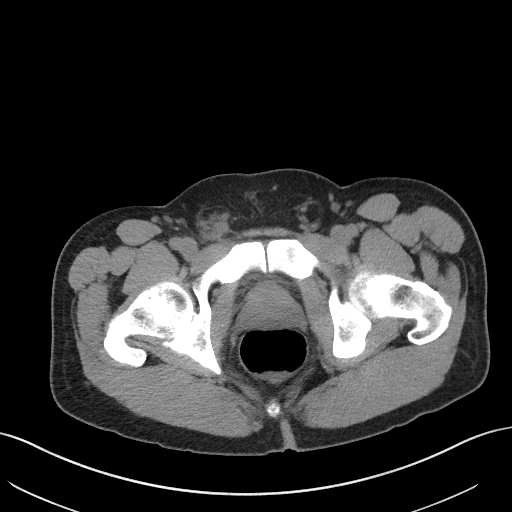
[im 28/99  soft-tissue]
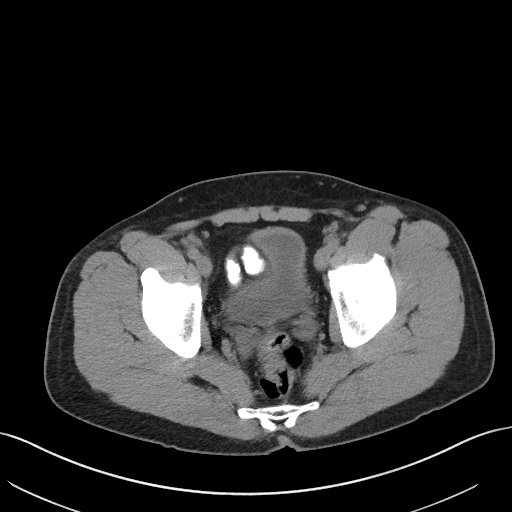
[im 36/99  soft-tissue]
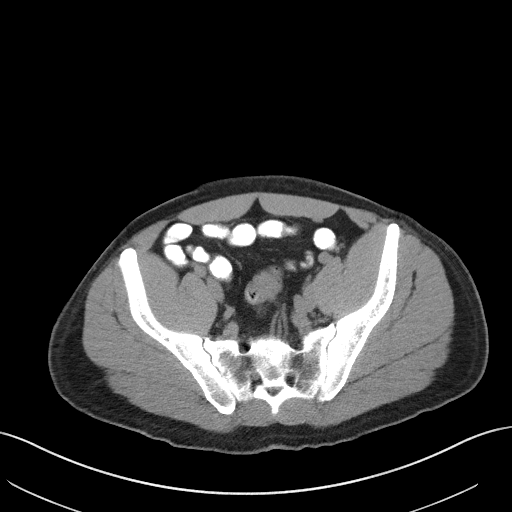
[im 44/99  soft-tissue]
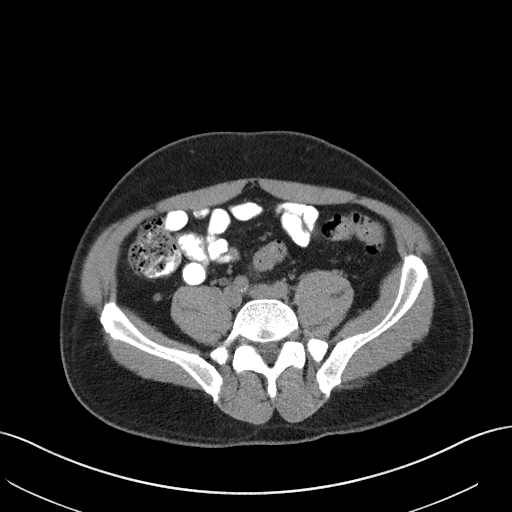
[im 51/99  soft-tissue]
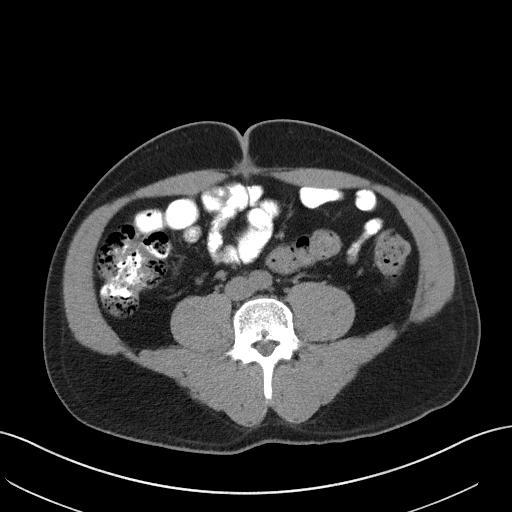
[im 55/99  soft-tissue]
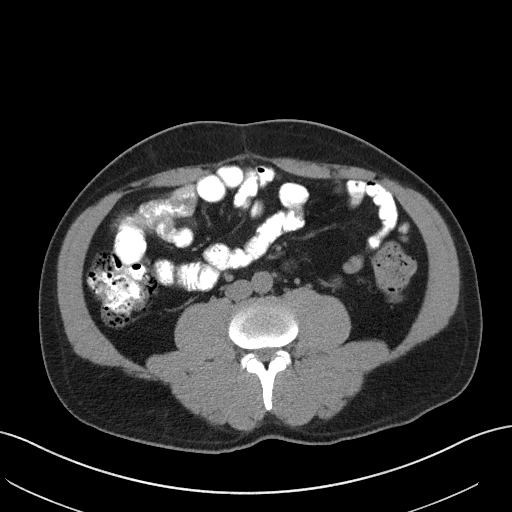
[im 63/99  soft-tissue]
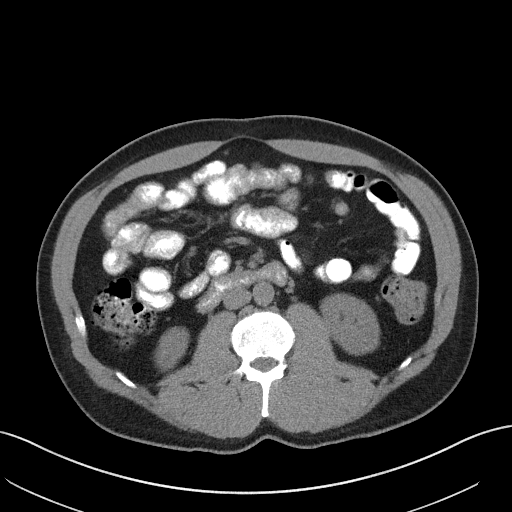
[im 63/99  bone]
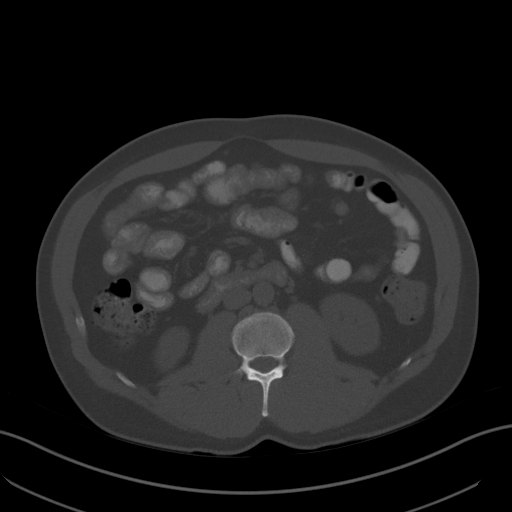
[im 71/99  soft-tissue]
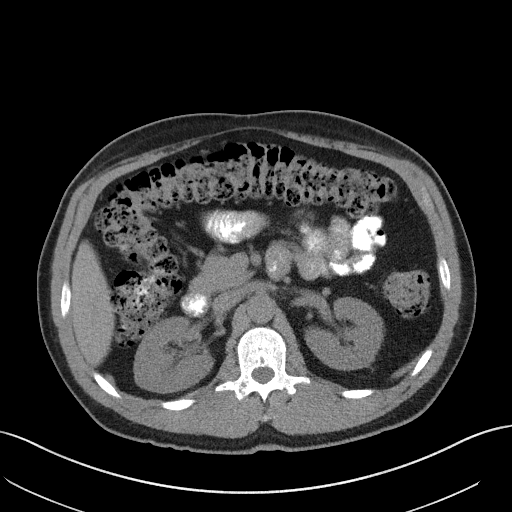
[im 79/99  soft-tissue]
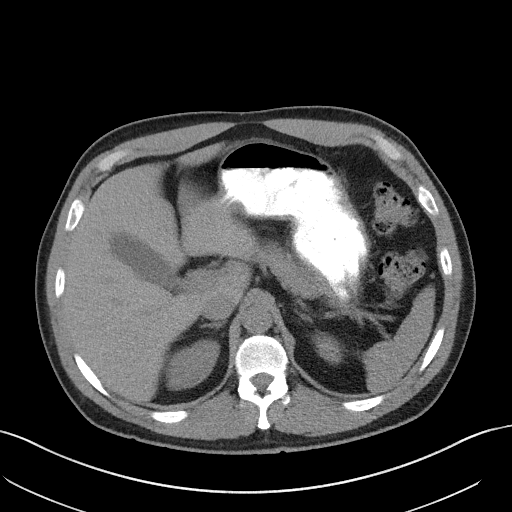
[im 87/99  soft-tissue]
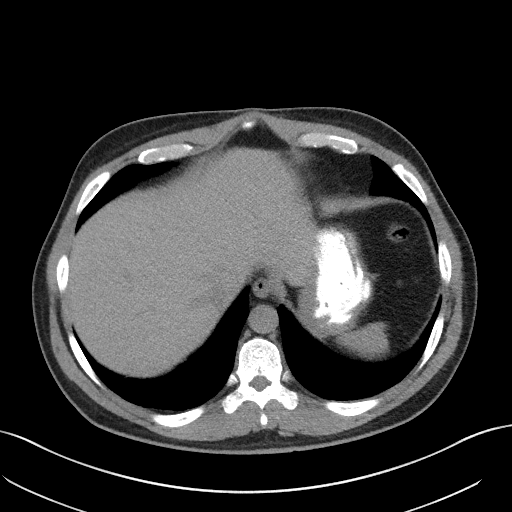
[im 95/99  soft-tissue]
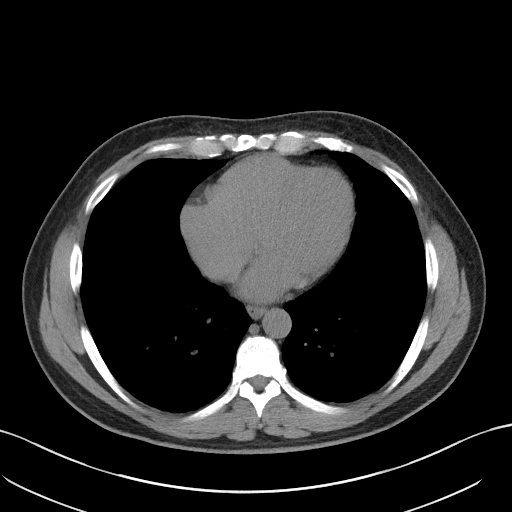

[Series 5: coronal st · coronal · 0.73mm/px · 3 of 91 slices shown]
[im 31/91  soft-tissue]
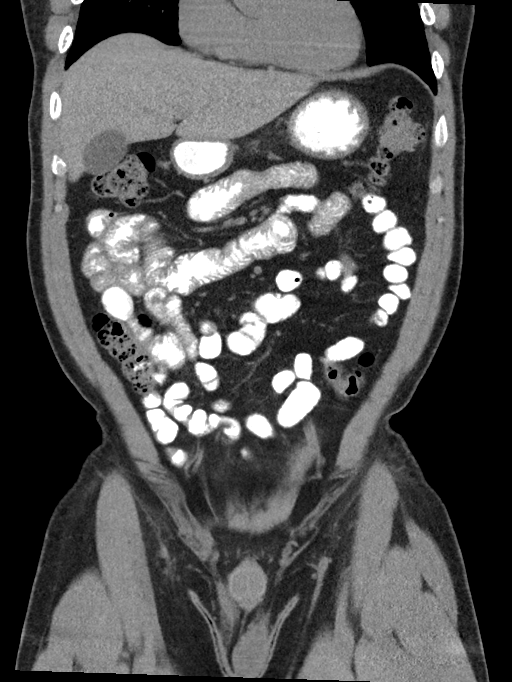
[im 41/91  soft-tissue]
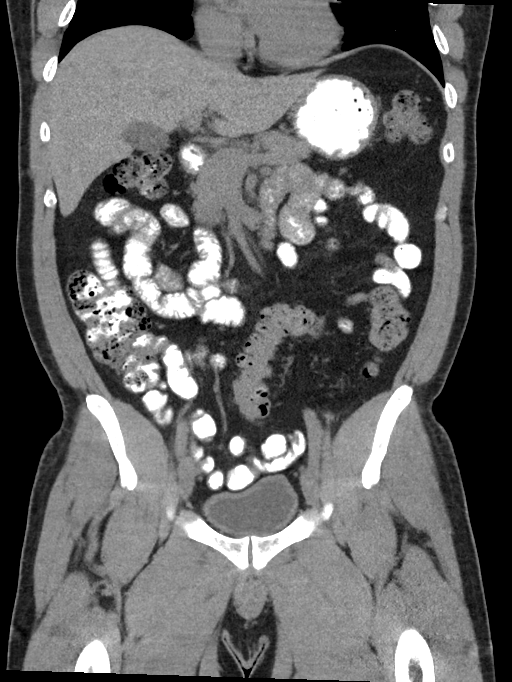
[im 51/91  soft-tissue]
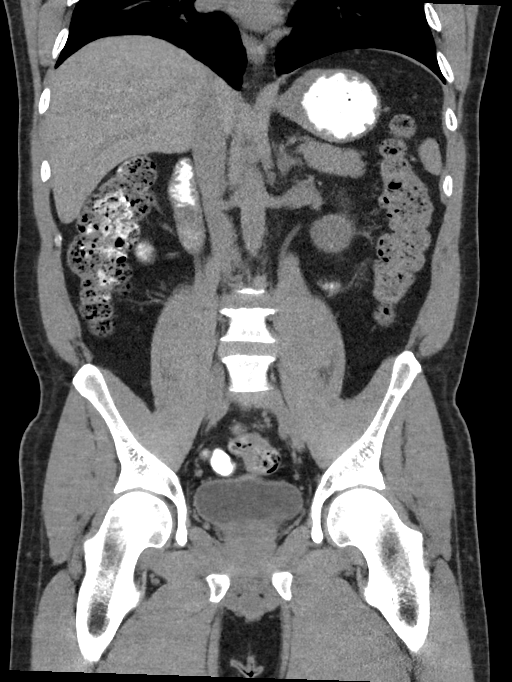

[16 of 46 positions shown; findings below may reference images not displayed]

FINDINGS: Lower chest: No acute abnormality.

Hepatobiliary: Normal noncontrast appearance. No gallstones,
gallbladder wall thickening, or biliary dilatation.

Pancreas: No pancreatic ductal dilatation or surrounding
inflammatory changes.

Spleen: Normal in size without focal abnormality. Perihilar
accessory spleen.

Adrenals/Urinary Tract: Adrenal glands are unremarkable. Kidneys are
normal, without renal calculi, focal lesion, or hydronephrosis.
Bladder is unremarkable.

Stomach/Bowel: Stomach is within normal limits. Appendix appears
normal. Enteral contrast opacification of stomach, small bowel and
proximal colon. No evidence of bowel wall thickening, distention, or
inflammatory changes.

Vascular/Lymphatic: Minimal aortic atherosclerosis. No enlarged
abdominal or pelvic lymph nodes.

Reproductive: Prostate is unremarkable. Mild engorgement of the
seminal vesicles

Other: Umbilical herniorrhaphy. Small fat-containing inguinal
hernias versus cord lipomas. No abdominopelvic ascites.

Musculoskeletal: No acute osseous findings.
IMPRESSION: No acute abdominopelvic findings.

## 2024-01-25 ENCOUNTER — Ambulatory Visit: Payer: Self-pay

## 2024-01-25 NOTE — Telephone Encounter (Signed)
 FYI Only or Action Required?: FYI only for provider: appointment scheduled on 01/26/24.  Patient was last seen in primary care on 03/26/2023 by Bernardo Fend, DO.  Called Nurse Triage reporting Jaundice & GERD.  Symptoms began several weeks ago.  Interventions attempted: Nothing.  Symptoms are: gradually worsening.  Triage Disposition: See Physician Within 24 Hours  Patient/caregiver understands and will follow disposition?:    Reason for Disposition  Jaundice suspected (e.g., yellow color of the skin and sclera [white of the eye])  Answer Assessment - Initial Assessment Questions Pts wife states pt is having GERD symptoms and that his sclera is yellow. Hx of hepatitis as a child. LFTs in Jan 2025 WNL. Denies any alcohol intake, denies excessive tylenol  usage. Denies fever, nausea, vomiting or abdominal pain. Hx of GERD, is not taking anything for his GERD. Previously on Prilosec.   Refused same day visit, scheduled for tomorrow. To call back with any additional or worsening of symptoms. ED precautions reviewed, pt verbalized understanding.  1. EYES: Are the whites of the eyes yellow?     Yes 2. ONSET: When did the jaundice begin?     A couple of weeks ago 3. FEVER: Do you have a fever? If Yes, ask: What is it, how was it measured, and when did it start?     Denies 4. VOMITING: Have you been vomiting?      Denies 5. ABDOMEN PAIN: Are you having any abdomen pain? If Yes, ask: How bad is it?  (e.g., Scale 0-10; or none, mild, moderate, or severe)     Denies  Protocols used: Jaundice-A-AH

## 2024-01-25 NOTE — Telephone Encounter (Signed)
 Copied from CRM #[id number]. Topic: Clinical - Red Word Triage >> Jan 25, 2024  9:40 AM Rosina BIRCH wrote: Reason for RMF:[id number] wife called stating the patient has excessive burping and his acid reflux is getting worse. Patient wife also stated the white part of his eyes is turning yellow

## 2024-01-25 NOTE — Telephone Encounter (Signed)
 Pt no longer on phone line at time of attempting to transfer to NT: will place call in call back folder

## 2024-01-26 ENCOUNTER — Ambulatory Visit: Admitting: Internal Medicine

## 2024-01-28 ENCOUNTER — Other Ambulatory Visit: Payer: Self-pay

## 2024-01-28 ENCOUNTER — Ambulatory Visit: Admitting: Internal Medicine

## 2024-01-28 ENCOUNTER — Encounter: Payer: Self-pay | Admitting: Internal Medicine

## 2024-01-28 VITALS — BP 128/82 | HR 68 | Temp 98.3°F | Resp 16 | Ht 63.0 in | Wt 164.1 lb

## 2024-01-28 DIAGNOSIS — E782 Mixed hyperlipidemia: Secondary | ICD-10-CM

## 2024-01-28 DIAGNOSIS — Z125 Encounter for screening for malignant neoplasm of prostate: Secondary | ICD-10-CM

## 2024-01-28 DIAGNOSIS — Z23 Encounter for immunization: Secondary | ICD-10-CM

## 2024-01-28 DIAGNOSIS — R1032 Left lower quadrant pain: Secondary | ICD-10-CM

## 2024-01-28 DIAGNOSIS — K219 Gastro-esophageal reflux disease without esophagitis: Secondary | ICD-10-CM

## 2024-01-28 MED ORDER — PANTOPRAZOLE SODIUM 40 MG PO TBEC: 40.0000 mg | DELAYED_RELEASE_TABLET | Freq: Every day | ORAL | 1 refills | Status: AC

## 2024-01-28 NOTE — Progress Notes (Signed)
 Acute Office Visit  Subjective:     Patient ID: Carl Mcfarland, male    DOB: 1970-09-03, 53 y.o.   MRN: [id number]  Chief Complaint  Patient presents with   Eye Problem    Change of color (yellowish)   Gastroesophageal Reflux   Flank Pain    Left side    Eye Problem  Pertinent negatives include no fever, nausea or vomiting.  Gastroesophageal Reflux He complains of abdominal pain and heartburn. He reports no nausea. Pertinent negatives include no melena or weight loss.  Flank Pain Associated symptoms include abdominal pain. Pertinent negatives include no dysuria, fever or weight loss.   Patient is in today for ongoing LLQ pain.   Discussed the use of AI scribe software for clinical note transcription with the patient, who gave verbal consent to proceed.  History of Present Illness Carl Mcfarland is a 53 year old male who presents with persistent left lower quadrant abdominal pain and acid reflux.  He has experienced persistent left lower quadrant abdominal pain for two years. The pain is constant, deep, and similar to a cut, localized to a small area. It follows a cyclical pattern, lasting about a month, then subsiding for a month before returning. The pain does not change with position and is not associated with groin pain. A small fat-containing inguinal hernia was identified on a CT scan over two years ago, but there is no current groin pain. Previous imaging showed normal abdominal organs.  He experiences acid reflux with a burning sensation, which improves slightly with Prilosec (omeprazole ) 20 mg daily but is not completely alleviated. The reflux is not related to whether his stomach is empty or full and is not associated with nausea or vomiting.  Bowel movements are regular, occurring once daily, without constipation, diarrhea, or blood. There are no urinary symptoms such as increased frequency, urgency, or burning. He drinks a lot of water and coffee. Colon cancer screening  with Cologuard was negative in 2023.   Review of Systems  Constitutional:  Negative for chills, fever and weight loss.  Gastrointestinal:  Positive for abdominal pain and heartburn. Negative for blood in stool, constipation, diarrhea, melena, nausea and vomiting.  Genitourinary:  Positive for flank pain. Negative for dysuria, frequency, hematuria and urgency.        Objective:    BP 128/82 (Cuff Size: Large)   Pulse 68   Temp 98.3 F (36.8 C) (Oral)   Resp 16   Ht 5' 3 (1.6 m)   Wt 164 lb 1.6 oz (74.4 kg)   SpO2 97%   BMI 29.07 kg/m    Physical Exam Constitutional:      Appearance: Normal appearance.  HENT:     Head: Normocephalic and atraumatic.  Eyes:     Conjunctiva/sclera: Conjunctivae normal.  Cardiovascular:     Rate and Rhythm: Normal rate and regular rhythm.  Pulmonary:     Effort: Pulmonary effort is normal.     Breath sounds: Normal breath sounds.  Abdominal:     General: Bowel sounds are normal. There is no distension.     Palpations: Abdomen is soft.     Tenderness: There is abdominal tenderness. There is no guarding or rebound.     Hernia: No hernia is present.     Comments: Very localized LLQ pain to palpation   Musculoskeletal:     Right lower leg: No edema.     Left lower leg: No edema.  Skin:    General:  Skin is warm and dry.  Neurological:     General: No focal deficit present.     Mental Status: He is alert. Mental status is at baseline.  Psychiatric:        Mood and Affect: Mood normal.        Behavior: Behavior normal.     No results found for any visits on 01/28/24.      Assessment & Plan:   Assessment & Plan Left lower quadrant abdominal pain Chronic pain for two years, intermittent, possibly due to diverticulitis or polyp. Previous CT showed small inguinal hernia, no other issues. - Ordered CT scan of the abdomen to evaluate for diverticulitis or polyp. - Scheduled follow-up in six weeks to review CT results and assess pain  management.  Gastroesophageal reflux disease (GERD) GERD with persistent symptoms despite omeprazole . No nausea or vomiting. - Switched to pantoprazole 40 mg daily. - Discontinued Pepcid. - Sent prescription for pantoprazole to pharmacy.  Hyperlipidemia - Recheck lipid panel today with other labs as he is fasting and continue statin for now.    - Flu vaccine trivalent PF, 6mos and older(Flulaval,Afluria,Fluarix,Fluzone) - Comprehensive Metabolic Panel (CMET) - CBC w/Diff/Platelet - Lipase - pantoprazole (PROTONIX) 40 MG tablet; Take 1 tablet (40 mg total) by mouth daily.  Dispense: 90 tablet; Refill: 1 - Lipid Profile - CT ABDOMEN PELVIS W CONTRAST; Future - PSA - Lipid Profile    Return in about 6 weeks (around 03/10/2024).  Sharyle Fischer, DO

## 2024-01-29 ENCOUNTER — Ambulatory Visit
Admission: RE | Admit: 2024-01-29 | Discharge: 2024-01-29 | Disposition: A | Discharge status: Home / Self Care | Source: Ambulatory Visit | Attending: Internal Medicine | Admitting: Internal Medicine

## 2024-01-29 ENCOUNTER — Ambulatory Visit: Payer: Self-pay | Admitting: Internal Medicine

## 2024-01-29 DIAGNOSIS — K219 Gastro-esophageal reflux disease without esophagitis: Secondary | ICD-10-CM | POA: Diagnosis present

## 2024-01-29 DIAGNOSIS — R1032 Left lower quadrant pain: Secondary | ICD-10-CM | POA: Diagnosis present

## 2024-01-29 LAB — CBC WITH DIFFERENTIAL/PLATELET
Absolute Lymphocytes: 2057 {cells}/uL (ref 850–3900)
Absolute Monocytes: 443 {cells}/uL (ref 200–950)
Basophils Absolute: 22 {cells}/uL (ref 0–200)
Basophils Relative: 0.4 %
Eosinophils Absolute: 59 {cells}/uL (ref 15–500)
Eosinophils Relative: 1.1 %
HCT: 45.8 % (ref 38.5–50.0)
Hemoglobin: 15.4 g/dL (ref 13.2–17.1)
MCH: 31.1 pg (ref 27.0–33.0)
MCHC: 33.6 g/dL (ref 32.0–36.0)
MCV: 92.5 fL (ref 80.0–100.0)
MPV: 9.9 fL (ref 7.5–12.5)
Monocytes Relative: 8.2 %
Neutro Abs: 2819 {cells}/uL (ref 1500–7800)
Neutrophils Relative %: 52.2 %
Platelets: 244 Thousand/uL (ref 140–400)
RBC: 4.95 Million/uL (ref 4.20–5.80)
RDW: 13.1 % (ref 11.0–15.0)
Total Lymphocyte: 38.1 %
WBC: 5.4 Thousand/uL (ref 3.8–10.8)

## 2024-01-29 LAB — LIPID PANEL
Cholesterol: 138 mg/dL (ref <200)
HDL: 50 mg/dL (ref >=40)
LDL Cholesterol (Calc): 73 mg/dL
Non-HDL Cholesterol (Calc): 88 mg/dL (ref <130)
Total CHOL/HDL Ratio: 2.8 (calc) (ref <5.0)
Triglycerides: 70 mg/dL (ref <150)

## 2024-01-29 LAB — COMPREHENSIVE METABOLIC PANEL WITH GFR
AG Ratio: 1.8 (calc) (ref 1.0–2.5)
ALT: 17 U/L (ref 9–46)
AST: 18 U/L (ref 10–35)
Albumin: 4.6 g/dL (ref 3.6–5.1)
Alkaline phosphatase (APISO): 67 U/L (ref 35–144)
BUN: 18 mg/dL (ref 7–25)
CO2: 26 mmol/L (ref 20–32)
Calcium: 9.2 mg/dL (ref 8.6–10.3)
Chloride: 106 mmol/L (ref 98–110)
Creat: 0.98 mg/dL (ref 0.70–1.30)
Globulin: 2.5 g/dL (ref 1.9–3.7)
Glucose, Bld: 77 mg/dL (ref 65–99)
Potassium: 4.4 mmol/L (ref 3.5–5.3)
Sodium: 140 mmol/L (ref 135–146)
Total Bilirubin: 0.5 mg/dL (ref 0.2–1.2)
Total Protein: 7.1 g/dL (ref 6.1–8.1)
eGFR: 92 mL/min/1.73m2 (ref >=60)

## 2024-01-29 LAB — PSA: PSA: 0.51 ng/mL (ref <=4.00)

## 2024-01-29 LAB — LIPASE: Lipase: 28 U/L (ref 7–60)

## 2024-01-29 MED ORDER — IOHEXOL 300 MG/ML  SOLN
100.0000 mL | Freq: Once | INTRAMUSCULAR | Status: AC | PRN
  Administered 2024-01-29: 100 mL via INTRAVENOUS

## 2024-02-15 ENCOUNTER — Ambulatory Visit

## 2024-02-16 ENCOUNTER — Ambulatory Visit (INDEPENDENT_AMBULATORY_CARE_PROVIDER_SITE_OTHER)

## 2024-02-16 ENCOUNTER — Ambulatory Visit: Payer: Self-pay | Admitting: Internal Medicine

## 2024-02-16 DIAGNOSIS — N3090 Cystitis, unspecified without hematuria: Secondary | ICD-10-CM

## 2024-02-16 LAB — POCT URINALYSIS DIPSTICK
Bilirubin, UA: NEGATIVE
Blood, UA: NEGATIVE
Glucose, UA: NEGATIVE
Ketones, UA: NEGATIVE
Leukocytes, UA: NEGATIVE
Nitrite, UA: NEGATIVE
Odor: NORMAL
Protein, UA: POSITIVE — AB
Spec Grav, UA: 1.025 (ref 1.010–1.025)
Urobilinogen, UA: 0.2 U/dL
pH, UA: 7.5 (ref 5.0–8.0)

## 2024-03-31 ENCOUNTER — Encounter: Payer: Self-pay | Admitting: Internal Medicine
